# Patient Record
Sex: Female | Born: 1965 | Race: White | Hispanic: No | Marital: Married | Smoking: Never smoker
Health system: Southern US, Community
[De-identification: ages and names within clinical notes are randomized; demographics above are authoritative.]

## PROBLEM LIST (undated history)

## (undated) DIAGNOSIS — F909 Attention-deficit hyperactivity disorder, unspecified type: Secondary | ICD-10-CM

## (undated) DIAGNOSIS — G4730 Sleep apnea, unspecified: Secondary | ICD-10-CM

## (undated) DIAGNOSIS — K219 Gastro-esophageal reflux disease without esophagitis: Secondary | ICD-10-CM

## (undated) DIAGNOSIS — F419 Anxiety disorder, unspecified: Secondary | ICD-10-CM

## (undated) DIAGNOSIS — M069 Rheumatoid arthritis, unspecified: Secondary | ICD-10-CM

## (undated) DIAGNOSIS — K760 Fatty (change of) liver, not elsewhere classified: Secondary | ICD-10-CM

## (undated) DIAGNOSIS — K2270 Barrett's esophagus without dysplasia: Secondary | ICD-10-CM

## (undated) HISTORY — DX: Barrett's esophagus without dysplasia: K22.70

## (undated) HISTORY — PX: HERNIA REPAIR: SHX51

## (undated) HISTORY — PX: CHOLECYSTECTOMY: SHX55

## (undated) HISTORY — DX: Fatty (change of) liver, not elsewhere classified: K76.0

## (undated) HISTORY — PX: OOPHORECTOMY: SHX86

## (undated) HISTORY — DX: Attention-deficit hyperactivity disorder, unspecified type: F90.9

## (undated) HISTORY — PX: OTHER SURGICAL HISTORY: SHX169

## (undated) HISTORY — PX: TONSILLECTOMY: SUR1361

## (undated) HISTORY — DX: Anxiety disorder, unspecified: F41.9

## (undated) HISTORY — PX: APPENDECTOMY: SHX54

## (undated) HISTORY — DX: Gastro-esophageal reflux disease without esophagitis: K21.9

## (undated) HISTORY — DX: Rheumatoid arthritis, unspecified: M06.9

---

## 2007-07-26 ENCOUNTER — Encounter: Payer: Self-pay | Admitting: Cardiovascular Disease

## 2007-07-26 ENCOUNTER — Ambulatory Visit: Payer: Self-pay | Admitting: Cardiovascular Disease

## 2007-07-26 ENCOUNTER — Ambulatory Visit: Payer: Self-pay

## 2007-07-26 LAB — CONVERTED CEMR LAB
Amylase: 43 units/L (ref 27–131)
Basophils Absolute: 0.1 10*3/uL (ref 0.0–0.1)
Bilirubin, Direct: 0.1 mg/dL (ref 0.0–0.3)
CK-MB: 1.9 ng/mL (ref 0.3–4.0)
CO2: 24 meq/L (ref 19–32)
Calcium: 9.8 mg/dL (ref 8.4–10.5)
GFR calc Af Amer: 119 mL/min
Hemoglobin: 14.1 g/dL (ref 12.0–15.0)
Lipase: 28 units/L (ref 11.0–59.0)
Lymphocytes Relative: 32.3 % (ref 12.0–46.0)
MCHC: 34.5 g/dL (ref 30.0–36.0)
Monocytes Absolute: 0.5 10*3/uL (ref 0.1–1.0)
Neutro Abs: 5.7 10*3/uL (ref 1.4–7.7)
RDW: 12.6 % (ref 11.5–14.6)
Sodium: 138 meq/L (ref 135–145)
Total Bilirubin: 0.7 mg/dL (ref 0.3–1.2)

## 2007-07-28 ENCOUNTER — Ambulatory Visit: Payer: Self-pay | Admitting: Cardiology

## 2009-02-24 HISTORY — PX: ESOPHAGOGASTRODUODENOSCOPY: SHX1529

## 2010-07-09 NOTE — Assessment & Plan Note (Signed)
[facility]                            CARDIOLOGY OFFICE NOTE   Le, Jessica                         MRN:          [id number]  DATE:07/26/2007                            DOB:          1965-11-10    Jessica Le is a 45-year patient who works here in the [facility].  She was added onto my schedule today for dizziness, shortness of breath,  and chest pain.  The patient has no previous a history of coronary  artery disease.  She has felt ill for about a week and half or so.  Apparently, she went to Urgent Care for some loose stools and question  of a UTI recently and was placed on antibiotics.   She was a bit short of breath yesterday.  She was playing at her  daughter's birthday party, with limited ambulation.  She felt some  presyncope and palpitations.  She got a little diaphoretic.  There was  no pleuritic chest pain, but this was unusual shortness of breath for  her.  She is a nonsmoker.  Does not have chronic lung disease.  She  subsequently felt poorly at night and had some loose stool.  She woke up  about 3:00 in the morning with significant chest pain.  It seem to be  centered around her esophagus.  She does have reflux and takes Zegerid.  She took some, and this did not improve the pain or burning in her  throat.  She subsequently vomited x1.  This morning, she was able to eat  breakfast.  However, she continues to have pressure and burning in her  throat and chest.  She has not had any significant fevers.  There have  been loose stools.  There has been no melena or bleeding.   The pain does not sound cardiac in nature.  Her shortness of breath  persists.  She is not coughing.  There is no sputum production or fever.   REVIEW OF SYSTEMS:  Otherwise negative.   PAST MEDICAL HISTORY:  1. Previous appendectomy.  2. Tonsillectomy.  3. C-section.  4. Sinus surgery.  5. Right oophorectomy.  6. D&C.  7. She actually was quite sick in  2007.  She was carrying 20-month-old      twins and apparently had a hemangioma rupture and bled down to a      hemoglobin of 1 or 2.   FAMILY HISTORY:  Unremarkable.  Mother is alive.  Father died at age 10  of liver cancer.   The patient is happily married.  She has one 67-year-old daughter whose  birthday just happened this weekend.  She does not smoke or drink.  She  is fairly sedentary and has gained about 20 pounds over the last couple  of years.  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   CURRENT MEDICATIONS:  Include Zegerid, estrogen patch, and Prometrium.   ALLERGIES:  1. LATEX.  2. CODEINE.  3. SULFA   PHYSICAL EXAMINATION:  GENERAL:  Remarkable for a healthy-appearing,  middle-aged white female in no distress.  VITAL SIGNS:  Pulse 76 and regular.  She does not appear toxic.  Weight  is 155, blood pressure is 140/80.  HEENT:  Unremarkable.  NECK:  Carotids are normal, without bruit, no lymphadenopathy,  thyromegaly, or JVP elevation.  LUNGS:  Clear.  Good diaphragmatic motion.  No wheezing.  HEART:  S1, S2, normal heart sounds, PMI normal.  ABDOMEN:  Soft.  She has some left lower quadrant pain to palpation,  also some right upper quadrant pain.  Bowel sounds are positive.  No  bruit, no hepatosplenomegaly, no hepatojugular reflux.  EXTREMITIES:  Distal pulses are intact, no edema.  NEUROLOGIC:  Nonfocal.  SKIN:  Warm and dry.  MUSCULOSKELETAL:  No muscle weakness.   EKG:  Normal.   IMPRESSION:  1. Chest pain, atypical.  Check CPK.  Follow up echocardiogram.  So      long as there are no regional wall motion abnormalities, I do not      think this needs further workup.  2. Nausea, loose stools, recent antibiotic.  She needs a Clostridium      difficile titer.  I am also little bit concerned about the left      lower quadrant pain.  We will do a CT scan to rule out      diverticulitis or intraabdominal process, particularly since she      has had a ruptured  hemangioma and previous abdominal surgeries.  3. Shortness of breath, with presyncope.  We will check a CT scan to      rule out PE.  A 2D echocardiogram will also be helpful in regards      to assessing RV and LV function.  4. Question urinary tract symptoms.  As well, the patient was      initially put on antibiotics because of urinary tract symptoms and      loose stools.  We will check a UA to rule out any significant      microscopic hematuria or recurrent infection.   I explained to the patient I did not think her symptoms had anything to  do with her heart.  She will need primary care followup, as something  apparently is going on which may be more GU/GI related.  She does have a  history of reflux.  She will continue Zegerid.  At some point, she may  need an EGD to further assess for esophagitis or gastritis.   We will start her preliminary workup in motion and rule out the most  severe issues and have her follow up with Dr. Tiburcio Pea in [place].     Noralyn Pick. Eden Emms, MD, [facility]  Electronically Signed    PCN/MedQ  DD: 07/26/2007  DT: 07/26/2007  Job #: [id number]   cc:   Emeline Gins, M.D.

## 2010-08-13 ENCOUNTER — Ambulatory Visit (HOSPITAL_BASED_OUTPATIENT_CLINIC_OR_DEPARTMENT_OTHER)
Admission: RE | Admit: 2010-08-13 | Payer: PRIVATE HEALTH INSURANCE | Source: Ambulatory Visit | Admitting: Orthopedic Surgery

## 2012-02-25 HISTORY — PX: COLONOSCOPY: SHX174

## 2016-02-25 HISTORY — PX: OTHER SURGICAL HISTORY: SHX169

## 2016-10-07 ENCOUNTER — Encounter: Payer: Self-pay | Admitting: Gastroenterology

## 2016-11-28 ENCOUNTER — Other Ambulatory Visit: Payer: Self-pay | Admitting: Gastroenterology

## 2016-11-28 ENCOUNTER — Ambulatory Visit (INDEPENDENT_AMBULATORY_CARE_PROVIDER_SITE_OTHER): Payer: No Typology Code available for payment source | Admitting: Gastroenterology

## 2016-11-28 ENCOUNTER — Encounter: Payer: Self-pay | Admitting: Gastroenterology

## 2016-11-28 VITALS — BP 139/88 | HR 82 | Temp 97.9°F | Ht 62.0 in | Wt 183.0 lb

## 2016-11-28 DIAGNOSIS — R194 Change in bowel habit: Secondary | ICD-10-CM | POA: Diagnosis not present

## 2016-11-28 DIAGNOSIS — R1011 Right upper quadrant pain: Secondary | ICD-10-CM | POA: Insufficient documentation

## 2016-11-28 DIAGNOSIS — K769 Liver disease, unspecified: Secondary | ICD-10-CM

## 2016-11-28 DIAGNOSIS — K2270 Barrett's esophagus without dysplasia: Secondary | ICD-10-CM | POA: Diagnosis not present

## 2016-11-28 DIAGNOSIS — R197 Diarrhea, unspecified: Secondary | ICD-10-CM | POA: Insufficient documentation

## 2016-11-28 NOTE — Patient Instructions (Signed)
1. MRI liver as scheduled.  2. Labs and stool tests as ordered. 3. I will obtain copy of your last colonoscopy and upper endoscopy results for review.

## 2016-11-28 NOTE — Progress Notes (Signed)
Primary Care Physician:  Bronwen Betters, FNP  Primary Gastroenterologist:  Barney Drain, MD   Chief Complaint  Patient presents with  . liver lesions    started 07/2016, fatigue, tender ruq    HPI:  Jessica Le is a 51 y.o. female here at the request of Bronwen Betters, FNP for further evaluation of liver lesions and RUQ pain.   Patient has multiple GI issues. Her prior gastroenterologist retired (Dr. Algis Greenhouse) and needs to establish care here. She has h/o Barrett's esophagus and is overdue for EGD. H/o liver lesions and FH of primary liver cancer (father deceased in his 58s).   Patient has not felt well in several months. She has severe fatigue. Falls asleep standing up. Falls asleep at stop lights. Had sleep study but states she has to go for additional testing this month. Complains of change in bowel habits. He used to have a bowel movement once daily. Now has postprandial diarrhea every time she eats. Wakes up early a.m. and has diarrhea. Blood on the toilet tissue at times. No melena. Denies associated abdominal pain. Never has solid stools. Denies significant reflux. Watching her diet carefully. Takes Zegerid only as needed. Had taken it regularly up until about 6 months ago. In the past had severe reflux associated with pregnancy. Denies dysphagia. She's also had a lot of pain including back pain and hip pain. Planes of extreme tenderness in the right upper quadrant, can't touch the area. Used to lay on her stomach when she slept this now. Sometimes pain is worse with food but often present without meals. Pain in the back as well. She states it feels like her gallbladder symptoms when she had gallstones. She complains of lesions that on her legs, turn dark red, developed a scale and scab. This is been occurring for a few weeks.  She has worked for years as a Public relations account executive. Now working in the [employer] since March. 8 hour shifts as opposed to 12 hour shifts. She has to take a nap when she gets  home before she can cooked supper.  CT abdomen and pelvis with and without contrast dated 10/01/2016, compared to November 2014 study 2 low-density space-occupying lesions noted in the liver, unchanged from prior study, probably cyst which measured 2.1 cm and 1.5 cm in diameter. She had a CT abdomen back in 2009 at our facility, posterior right hepatic lobe 1.7 cm lesion with peripheral enhancement and possible nodular enhancement. Another right hepatic lobe lesion measuring 4 mm to smaller evaluate. MRI recommended at the time.  Patient's father deceased in his 45s with primary liver cancer. Never drank alcohol. Was not a cirrhotic, no history of viral hepatitis.  Patient has a history of pregnancy loss at age 9, conceived quads through IVF, with amniocentesis she lost one baby, at 57 weeks she lost another baby. At 20 weeks she developed hemorrhaging and lost the last 2 babies. She states her hemoglobin dropped to 1.8, went into cardiac arrest. She states that there was concerned about her liver and an hemangioma at that time.   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 Essentially asymptomatic at this time  Current Outpatient Prescriptions  Medication Sig Dispense Refill  . azelastine (ASTELIN) 0.1 % nasal spray Place 1 spray into both nostrils 2 (two) times daily. Use in each nostril as directed    . estradiol (VIVELLE-DOT) 0.1 MG/24HR patch Place 1 patch onto the skin 2 (two) times a week.    Marland Kitchen  Multiple Vitamin (MULTIVITAMIN) tablet Take 1 tablet by mouth daily.    Earney Navy Bicarbonate (ZEGERID) 20-1100 MG CAPS capsule Take 1 capsule by mouth 2 (two) times daily.    . progesterone (PROMETRIUM) 100 MG capsule Take 100 mg by mouth daily. Takes first 10 days of every month    . triamcinolone (NASACORT ALLERGY 24HR) 55 MCG/ACT AERO nasal inhaler Place 1 spray into the nose 2 (two) times daily.     No current facility-administered medications for this visit.      Allergies as of 11/28/2016 - Review Complete 11/28/2016  Allergen Reaction Noted  . Latex Anaphylaxis 11/28/2016  . Vancomycin Anaphylaxis 11/28/2016  . Sulfa antibiotics  11/28/2016    Past Medical History:  Diagnosis Date  . ADHD   . Anxiety   . Barrett's esophagus   . GERD (gastroesophageal reflux disease)   . RA (rheumatoid arthritis) ([place])     Past Surgical History:  Procedure Laterality Date  . APPENDECTOMY    . Bilateral myringotomy and tube placement  2018  . CESAREAN SECTION    . CHOLECYSTECTOMY    . COLONOSCOPY  2014  . ESOPHAGOGASTRODUODENOSCOPY  2011  . HERNIA REPAIR     incisional hernia  . Left ulnar surgery    . OOPHORECTOMY    . TONSILLECTOMY      Family History  Problem Relation Age of Onset  . Liver cancer Father   . Colon cancer Neg Hx     Social History   Social History  . Marital status: Married    Spouse name: N/A  . Number of children: N/A  . Years of education: N/A   Occupational History  . Not on file.   Social History Main Topics  . Smoking status: Never Smoker  . Smokeless tobacco: Never Used  . Alcohol use No  . Drug use: No  . Sexual activity: Not on file   Other Topics Concern  . Not on file   Social History Narrative  . No narrative on file      ROS:  General: Negative for anorexia, unintentional weight loss, fever, chills, weakness.  +fatigue Eyes: Negative for vision changes.  ENT: Negative for hoarseness, difficulty swallowing , nasal congestion. CV: Negative for chest pain, angina, palpitations, dyspnea on exertion, peripheral edema.  Respiratory: Negative for dyspnea at rest, dyspnea on exertion, cough, sputum, wheezing.  GI: See history of present illness. GU:  Negative for dysuria, hematuria, urinary incontinence, urinary frequency, nocturnal urination.  MS: chronic pain Derm: Negative for rash or itching.  Neuro: Negative for weakness, abnormal sensation, seizure, frequent headaches, memory loss,  confusion.  Psych: Negative for anxiety, depression, suicidal ideation, hallucinations.  Endo: Negative for unusual weight change.  Heme: Negative for bruising or bleeding. Allergy: Negative for rash or hives.    Physical Examination:  BP 139/88   Pulse 82   Temp 97.9 F (36.6 C) (Oral)   Ht 5\' 2"  (1.575 m)   Wt 183 lb (83 kg)   LMP 09/28/2016 Comment: very few periods  BMI 33.47 kg/m    General: Well-nourished, well-developed in no acute distress. Tearful. Head: Normocephalic, atraumatic.   Eyes: Conjunctiva pink, no icterus. Mouth: Oropharyngeal mucosa moist and pink , no lesions erythema or exudate. Neck: Supple without thyromegaly, masses, or lymphadenopathy.  Lungs: Clear to auscultation bilaterally.  Heart: Regular rate and rhythm, no murmurs rubs or gallops.  Abdomen: Bowel sounds are normal,  nondistended, no hepatosplenomegaly or masses, no abdominal bruits or  hernia , no rebound or guarding.  Moderate RUQ tenderness. She also had anterior right lower ribcage tenderness Rectal: not performed Extremities: No lower extremity edema. No clubbing or deformities.  Neuro: Alert and oriented x 4 , grossly normal neurologically.  Skin: Warm and dry, no rash or jaundice.  Erythematous type, circular, papular lesion with eschar on the right anterior shin, induration noted. Psych: Alert and cooperative, normal mood and affect.   Imaging Studies: No results found.  Impression/plan:  51 year old female with multiple GI issues which require further evaluation. She reports history of Barrett's esophagus, due for surveillance EGD. We have requested records for review. She also has 2 known liver lesions as outlined, previously recommended MRI for further evaluation. Father succumbed to primary liver cancer in his 67s. Patient obviously quite concerned about these liver lesions. Initial CT with possible hemangioma versus nodular lesion. Would recommend MRI with Eovist at this time.    Patient with right upper quadrant pain, unclear etiology, at least in part musculoskeletal component. Doubt biliary etiology but we'll obtain labs. Cannot exclude duodenitis/gastritis/peptic ulcer disease. Further evaluation planned.  Change in bowel habits with postprandial diarrhea. No solid stools. Cannot exclude IBS whenever given daily exposures in the hospital setting, we will exclude infectious etiology such as C. difficile. Screen for celiac. Will likely need colonoscopy in the near future. Awaiting records for review. Unclear at this time if skin lesions are related to any of her GI issues although inflammatory bowel disease needs to be entertained.

## 2016-11-30 NOTE — Progress Notes (Signed)
Labs including cbc, kidney/liver, lipase, thyroid normal.  Celiac pending. MRI pending.

## 2016-12-01 ENCOUNTER — Telehealth: Payer: Self-pay

## 2016-12-01 LAB — CBC WITH DIFFERENTIAL/PLATELET
BASOS ABS: 0 10*3/uL (ref 0.0–0.2)
BASOS: 0 %
EOS (ABSOLUTE): 0.2 10*3/uL (ref 0.0–0.4)
Eos: 2 %
HEMOGLOBIN: 13.5 g/dL (ref 11.1–15.9)
Hematocrit: 39.9 % (ref 34.0–46.6)
IMMATURE GRANS (ABS): 0 10*3/uL (ref 0.0–0.1)
IMMATURE GRANULOCYTES: 0 %
LYMPHS: 33 %
Lymphocytes Absolute: 3.1 10*3/uL (ref 0.7–3.1)
MCH: 31.6 pg (ref 26.6–33.0)
MCHC: 33.8 g/dL (ref 31.5–35.7)
MCV: 93 fL (ref 79–97)
MONOCYTES: 4 %
Monocytes Absolute: 0.4 10*3/uL (ref 0.1–0.9)
NEUTROS ABS: 5.6 10*3/uL (ref 1.4–7.0)
NEUTROS PCT: 61 %
Platelets: 290 10*3/uL (ref 150–379)
RBC: 4.27 x10E6/uL (ref 3.77–5.28)
RDW: 14.3 % (ref 12.3–15.4)
WBC: 9.4 10*3/uL (ref 3.4–10.8)

## 2016-12-01 LAB — COMPREHENSIVE METABOLIC PANEL
ALT: 27 IU/L (ref 0–32)
AST: 18 IU/L (ref 0–40)
Albumin/Globulin Ratio: 2.2 (ref 1.2–2.2)
Albumin: 4.4 g/dL (ref 3.5–5.5)
Alkaline Phosphatase: 94 IU/L (ref 39–117)
BUN/Creatinine Ratio: 17 (ref 9–23)
BUN: 11 mg/dL (ref 6–24)
Bilirubin Total: 0.3 mg/dL (ref 0.0–1.2)
CALCIUM: 9.3 mg/dL (ref 8.7–10.2)
CO2: 23 mmol/L (ref 20–29)
Chloride: 103 mmol/L (ref 96–106)
Creatinine, Ser: 0.63 mg/dL (ref 0.57–1.00)
GFR, EST AFRICAN AMERICAN: 120 mL/min/{1.73_m2} (ref >59)
GFR, EST NON AFRICAN AMERICAN: 104 mL/min/{1.73_m2} (ref >59)
GLUCOSE: 82 mg/dL (ref 65–99)
Globulin, Total: 2 g/dL (ref 1.5–4.5)
Potassium: 4.4 mmol/L (ref 3.5–5.2)
Sodium: 142 mmol/L (ref 134–144)
TOTAL PROTEIN: 6.4 g/dL (ref 6.0–8.5)

## 2016-12-01 LAB — GI PROFILE, STOOL, PCR
ASTROVIRUS: NOT DETECTED
Adenovirus F 40/41: NOT DETECTED
C difficile toxin A/B: NOT DETECTED
Campylobacter: NOT DETECTED
Cryptosporidium: NOT DETECTED
Cyclospora cayetanensis: NOT DETECTED
ENTEROPATHOGENIC E COLI: NOT DETECTED
ENTEROTOXIGENIC E COLI: NOT DETECTED
Entamoeba histolytica: NOT DETECTED
Enteroaggregative E coli: NOT DETECTED
Giardia lamblia: NOT DETECTED
Norovirus GI/GII: NOT DETECTED
PLESIOMONAS SHIGELLOIDES: NOT DETECTED
ROTAVIRUS A: NOT DETECTED
SAPOVIRUS: NOT DETECTED
SHIGA-TOXIN-PRODUCING E COLI: NOT DETECTED
Salmonella: NOT DETECTED
Shigella/Enteroinvasive E coli: NOT DETECTED
VIBRIO CHOLERAE: NOT DETECTED
VIBRIO: NOT DETECTED
Yersinia enterocolitica: NOT DETECTED

## 2016-12-01 LAB — TSH: TSH: 1.35 u[IU]/mL (ref 0.450–4.500)

## 2016-12-01 LAB — TISSUE TRANSGLUTAMINASE, IGA

## 2016-12-01 LAB — LIPASE: Lipase: 32 U/L (ref 14–72)

## 2016-12-01 LAB — IGA: IgA/Immunoglobulin A, Serum: 295 mg/dL (ref 87–352)

## 2016-12-01 NOTE — Progress Notes (Signed)
Celiac negative too.

## 2016-12-01 NOTE — Telephone Encounter (Signed)
[facility] and spoke to [place]. No PA needed for MRI. No reference numbers given.

## 2016-12-01 NOTE — Progress Notes (Signed)
LMOM to call.

## 2016-12-01 NOTE — Telephone Encounter (Signed)
MRI Liver scheduled for 12/08/16 at 9:00am, pt tor arrive at 8:45am. NPO for 4 hours before test. Tried to call pt to inform her of appt, no answer, LMOVM. Letter also mailed.

## 2016-12-01 NOTE — Progress Notes (Signed)
Stool negative. Await pending studies.

## 2016-12-02 NOTE — Telephone Encounter (Signed)
Pt called office. She rescheduled MRI time but for the same day.

## 2016-12-02 NOTE — Progress Notes (Signed)
CC'ED TO PCP 

## 2016-12-02 NOTE — Progress Notes (Signed)
Pt is aware.  

## 2016-12-08 ENCOUNTER — Ambulatory Visit (HOSPITAL_COMMUNITY): Payer: No Typology Code available for payment source

## 2016-12-12 ENCOUNTER — Ambulatory Visit (HOSPITAL_COMMUNITY)
Admission: RE | Admit: 2016-12-12 | Discharge: 2016-12-12 | Disposition: A | Discharge status: Home / Self Care | Payer: No Typology Code available for payment source | Source: Ambulatory Visit | Attending: Gastroenterology | Admitting: Gastroenterology

## 2016-12-12 DIAGNOSIS — D1809 Hemangioma of other sites: Secondary | ICD-10-CM | POA: Insufficient documentation

## 2016-12-12 DIAGNOSIS — K760 Fatty (change of) liver, not elsewhere classified: Secondary | ICD-10-CM | POA: Diagnosis not present

## 2016-12-12 DIAGNOSIS — K769 Liver disease, unspecified: Secondary | ICD-10-CM | POA: Diagnosis present

## 2016-12-12 DIAGNOSIS — K7689 Other specified diseases of liver: Secondary | ICD-10-CM | POA: Insufficient documentation

## 2016-12-12 MED ORDER — GADOXETATE DISODIUM 0.25 MMOL/ML IV SOLN
8.0000 mL | Freq: Once | INTRAVENOUS | Status: AC | PRN
  Administered 2016-12-12: 8 mL via INTRAVENOUS

## 2016-12-26 NOTE — Progress Notes (Signed)
Noele, your MRI showed moderate to severe fatty liver. 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   For fatty liver--> Instructions for fatty liver: Recommend 1-2# weight loss per week until ideal body weight through exercise & diet. Low fat/cholesterol diet.   Avoid sweets, sodas, fruit juices, sweetened beverages like tea, etc. Gradually increase exercise from 15 min daily up to 1 hr per day 5 days/week. Limit alcohol use.  Would advise to proceed with upper endoscopy and colonoscopy with Dr. Oneida Alar. Dx: Barrett's esophagus, Right upper abdominal pain, change in bowels (diarrhea), rectal bleeding.   You may require additional f/u liver imaging to ensure stability of liver lesions, will discuss with Dr. Oneida Alar.

## 2016-12-29 ENCOUNTER — Encounter: Payer: Self-pay | Admitting: *Deleted

## 2016-12-29 ENCOUNTER — Other Ambulatory Visit: Payer: Self-pay | Admitting: *Deleted

## 2016-12-29 DIAGNOSIS — K625 Hemorrhage of anus and rectum: Secondary | ICD-10-CM

## 2016-12-29 DIAGNOSIS — K2270 Barrett's esophagus without dysplasia: Secondary | ICD-10-CM

## 2016-12-29 DIAGNOSIS — R1011 Right upper quadrant pain: Secondary | ICD-10-CM

## 2016-12-29 DIAGNOSIS — R197 Diarrhea, unspecified: Secondary | ICD-10-CM

## 2016-12-29 MED ORDER — NA SULFATE-K SULFATE-MG SULF 17.5-3.13-1.6 GM/177ML PO SOLN: 1.0000 | ORAL | 0 refills | Status: DC

## 2016-12-29 NOTE — Progress Notes (Signed)
LMOM to call.

## 2016-12-30 NOTE — Progress Notes (Signed)
PT is aware.

## 2016-12-30 NOTE — Progress Notes (Signed)
LMOM to call.

## 2017-01-26 ENCOUNTER — Other Ambulatory Visit: Payer: Self-pay

## 2017-01-26 ENCOUNTER — Ambulatory Visit (HOSPITAL_COMMUNITY)
Admission: RE | Admit: 2017-01-26 | Discharge: 2017-01-26 | Disposition: A | Discharge status: Home / Self Care | Payer: No Typology Code available for payment source | Source: Ambulatory Visit | Attending: Gastroenterology | Admitting: Gastroenterology

## 2017-01-26 ENCOUNTER — Encounter (HOSPITAL_COMMUNITY)
Admission: RE | Disposition: A | Discharge status: Home / Self Care | Payer: Self-pay | Source: Ambulatory Visit | Attending: Gastroenterology

## 2017-01-26 ENCOUNTER — Encounter (HOSPITAL_COMMUNITY): Payer: Self-pay | Admitting: *Deleted

## 2017-01-26 DIAGNOSIS — K2270 Barrett's esophagus without dysplasia: Secondary | ICD-10-CM | POA: Diagnosis not present

## 2017-01-26 DIAGNOSIS — G4730 Sleep apnea, unspecified: Secondary | ICD-10-CM | POA: Insufficient documentation

## 2017-01-26 DIAGNOSIS — K210 Gastro-esophageal reflux disease with esophagitis: Secondary | ICD-10-CM | POA: Diagnosis not present

## 2017-01-26 DIAGNOSIS — K625 Hemorrhage of anus and rectum: Secondary | ICD-10-CM

## 2017-01-26 DIAGNOSIS — K2980 Duodenitis without bleeding: Secondary | ICD-10-CM | POA: Diagnosis not present

## 2017-01-26 DIAGNOSIS — K228 Other specified diseases of esophagus: Secondary | ICD-10-CM | POA: Diagnosis not present

## 2017-01-26 DIAGNOSIS — K5730 Diverticulosis of large intestine without perforation or abscess without bleeding: Secondary | ICD-10-CM | POA: Insufficient documentation

## 2017-01-26 DIAGNOSIS — K2950 Unspecified chronic gastritis without bleeding: Secondary | ICD-10-CM | POA: Diagnosis not present

## 2017-01-26 DIAGNOSIS — Q438 Other specified congenital malformations of intestine: Secondary | ICD-10-CM | POA: Insufficient documentation

## 2017-01-26 DIAGNOSIS — F419 Anxiety disorder, unspecified: Secondary | ICD-10-CM | POA: Insufficient documentation

## 2017-01-26 DIAGNOSIS — R197 Diarrhea, unspecified: Secondary | ICD-10-CM | POA: Diagnosis not present

## 2017-01-26 DIAGNOSIS — Z79899 Other long term (current) drug therapy: Secondary | ICD-10-CM | POA: Insufficient documentation

## 2017-01-26 DIAGNOSIS — R1011 Right upper quadrant pain: Secondary | ICD-10-CM | POA: Insufficient documentation

## 2017-01-26 DIAGNOSIS — M069 Rheumatoid arthritis, unspecified: Secondary | ICD-10-CM | POA: Diagnosis not present

## 2017-01-26 DIAGNOSIS — F909 Attention-deficit hyperactivity disorder, unspecified type: Secondary | ICD-10-CM | POA: Insufficient documentation

## 2017-01-26 HISTORY — PX: COLONOSCOPY: SHX5424

## 2017-01-26 HISTORY — DX: Sleep apnea, unspecified: G47.30

## 2017-01-26 HISTORY — PX: ESOPHAGOGASTRODUODENOSCOPY: SHX5428

## 2017-01-26 SURGERY — COLONOSCOPY
Anesthesia: Moderate Sedation

## 2017-01-26 MED ORDER — MIDAZOLAM HCL 5 MG/5ML IJ SOLN
INTRAMUSCULAR | Status: DC | PRN
  Administered 2017-01-26: 1 mg via INTRAVENOUS
  Administered 2017-01-26: 2 mg via INTRAVENOUS
  Administered 2017-01-26: 1 mg via INTRAVENOUS
  Administered 2017-01-26: 2 mg via INTRAVENOUS
  Administered 2017-01-26: 1 mg via INTRAVENOUS

## 2017-01-26 MED ORDER — MEPERIDINE HCL 100 MG/ML IJ SOLN
INTRAMUSCULAR | Status: DC | PRN
  Administered 2017-01-26 (×2): 50 mg via INTRAVENOUS
  Administered 2017-01-26: 25 mg via INTRAVENOUS

## 2017-01-26 MED ORDER — ONDANSETRON HCL 4 MG/2ML IJ SOLN
INTRAMUSCULAR | Status: AC
  Filled 2017-01-26: qty 2

## 2017-01-26 MED ORDER — MEPERIDINE HCL 100 MG/ML IJ SOLN
INTRAMUSCULAR | Status: AC
  Filled 2017-01-26: qty 2

## 2017-01-26 MED ORDER — STERILE WATER FOR IRRIGATION IR SOLN
Status: DC | PRN
  Administered 2017-01-26: 100 mL

## 2017-01-26 MED ORDER — OMEPRAZOLE-SODIUM BICARBONATE 40-1100 MG PO CAPS
1.0000 | ORAL_CAPSULE | Freq: Every day | ORAL | 11 refills | Status: DC
Start: 2017-01-26 — End: 2022-06-20

## 2017-01-26 MED ORDER — MIDAZOLAM HCL 5 MG/5ML IJ SOLN
INTRAMUSCULAR | Status: AC
  Filled 2017-01-26: qty 10

## 2017-01-26 MED ORDER — LIDOCAINE VISCOUS 2 % MT SOLN
OROMUCOSAL | Status: AC
  Filled 2017-01-26: qty 15

## 2017-01-26 MED ORDER — SODIUM CHLORIDE 0.9 % IV SOLN
INTRAVENOUS | Status: DC
  Administered 2017-01-26: 11:00:00 via INTRAVENOUS

## 2017-01-26 MED ORDER — ONDANSETRON HCL 4 MG/2ML IJ SOLN
4.0000 mg | Freq: Once | INTRAMUSCULAR | Status: AC
  Administered 2017-01-26: 4 mg via INTRAVENOUS

## 2017-01-26 NOTE — Op Note (Signed)
[facility] Patient Name: Jessica Le Procedure Date: 01/26/2017 12:26 PM MRN: [id number] Date of Birth: 02-07-1966 Attending MD: Barney Drain MD, MD CSN: [id number] Age: 51 Admit Type: Outpatient Procedure:                Upper GI endoscopy WITH COLD FORCEPS BIOPSY Indications:              Abdominal pain in the right upper quadrant,                            Dyspepsia, Diarrhea Providers:                Barney Drain MD, MD, Janeece Riggers, RN, Tammy Vaught,                            RN Referring MD:              Medicines:                Midazolam 2 mg IV Complications:            No immediate complications. Estimated Blood Loss:     Estimated blood loss was minimal. Procedure:                Pre-Anesthesia Assessment:                           - Prior to the procedure, a History and Physical                            was performed, and patient medications and                            allergies were reviewed. The patient's tolerance of                            previous anesthesia was also reviewed. The risks                            and benefits of the procedure and the sedation                            options and risks were discussed with the patient.                            All questions were answered, and informed consent                            was obtained. Prior Anticoagulants: The patient has                            taken no previous anticoagulant or antiplatelet                            agents. ASA Grade Assessment: II - A patient with  mild systemic disease. After reviewing the risks                            and benefits, the patient was deemed in                            satisfactory condition to undergo the procedure.                            After obtaining informed consent, the endoscope was                            passed under direct vision. Throughout the                            procedure, the patient's  blood pressure, pulse, and                            oxygen saturations were monitored continuously. The                            [id number]) was introduced through the mouth,                            and advanced to the second part of duodenum. The                            upper GI endoscopy was accomplished without                            difficulty. The patient tolerated the procedure                            fairly well. Scope In: 12:29:39 PM Scope Out: 12:39:08 PM Total Procedure Duration: 0 hours 9 minutes 29 seconds  Findings:      The esophagus and gastroesophageal junction were examined with white       light from a forward view and retroflexed position. There were       esophageal mucosal changes consistent with short-segment Barrett's       esophagus. These changes involved the mucosa extending to the Z-line.       The maximum longitudinal extent of these esophageal mucosal changes was       1 cm in length. This was biopsied with a cold forceps for histology.      A few small sessile polyps were found in the gastric fundus and in the       gastric body. The polyp was removed with a cold biopsy forceps.       Resection and retrieval were complete.      A medium-sized hiatal hernia was present.      Patchy mild inflammation characterized by congestion (edema) and       erythema was found in the gastric antrum. Biopsies were taken with a       cold forceps for Helicobacter pylori testing(2: BODY, 3:       ANTRUM/ANGULARIS).  The examined duodenum was normal. Biopsies for histology were taken with       a cold forceps for evaluation of celiac disease(2: BULB, 4: 2ND PORTION). Impression:               - Esophageal mucosal changes consistent with                            short-segment Barrett's esophagus. Biopsied.                           - A few gastric polyps. Resected and retrieved.                           - Medium-sized hiatal hernia.                            - Gastritis. Biopsied.                           - Normal examined duodenum. Biopsied. Moderate Sedation:      Moderate (conscious) sedation was administered by the endoscopy nurse       and supervised by the endoscopist. The following parameters were       monitored: oxygen saturation, heart rate, blood pressure, and response       to care. Total physician intraservice time was 48 minutes. Recommendation:           - Await pathology results.                           - Follow an antireflux regimen indefinitely.                           - Continue present medications.                           - Use Zegerid (omeprazole+bicarb) 40 mg tab by                            mouth daily.                           - Return to my office in 3 months.                           - Patient has a contact number available for                            emergencies. The signs and symptoms of potential                            delayed complications were discussed with the                            patient. Return to normal activities tomorrow.  Written discharge instructions were provided to the                            patient. Procedure Code(s):        --- Professional ---                           [phone], Esophagogastroduodenoscopy, flexible,                            transoral; with biopsy, single or multiple                           99152, Moderate sedation services provided by the                            same physician or other qualified health care                            professional performing the diagnostic or                            therapeutic service that the sedation supports,                            requiring the presence of an independent trained                            observer to assist in the monitoring of the                            patient's level of consciousness and physiological                            status; initial  15 minutes of intraservice time,                            patient age 20 years or older                           [phone], Moderate sedation services; each additional                            15 minutes intraservice time                           99153, Moderate sedation services; each additional                            15 minutes intraservice time Diagnosis Code(s):        --- Professional ---                           K22.8, Other specified diseases of esophagus  K31.7, Polyp of stomach and duodenum                           K44.9, Diaphragmatic hernia without obstruction or                            gangrene                           K29.70, Gastritis, unspecified, without bleeding                           R10.11, Right upper quadrant pain                           R10.13, Epigastric pain                           R19.7, Diarrhea, unspecified CPT copyright 2016 American Medical Association. All rights reserved. The codes documented in this report are preliminary and upon coder review may  be revised to meet current compliance requirements. Barney Drain, MD Barney Drain MD, MD 01/26/2017 12:58:51 PM This report has been signed electronically. Number of Addenda: 0

## 2017-01-26 NOTE — Progress Notes (Signed)
Jessica Le may NOT return to work until AFTER 1:30 PM on Tuesday, January 27, 2017. Questions, call [phone]. Thanks!

## 2017-01-26 NOTE — Progress Notes (Signed)
Pt placed on nasal CPAP and tolerating well.

## 2017-01-26 NOTE — H&P (Signed)
Primary Care Physician:  Bronwen Betters, FNP Primary Gastroenterologist:  Dr. Oneida Alar  Pre-Procedure History & Physical: HPI:  Jessica Le is a 51 y.o. female here for Diarrhea/DYSPEPSIA.  Past Medical History:  Diagnosis Date  . ADHD   . Anxiety   . Barrett's esophagus   . GERD (gastroesophageal reflux disease)   . RA (rheumatoid arthritis) ([place])   . Sleep apnea     Past Surgical History:  Procedure Laterality Date  . APPENDECTOMY    . Bilateral myringotomy and tube placement  2018  . CESAREAN SECTION    . CHOLECYSTECTOMY    . COLONOSCOPY  2014  . ESOPHAGOGASTRODUODENOSCOPY  2011  . HERNIA REPAIR     incisional hernia  . Left ulnar surgery    . OOPHORECTOMY    . TONSILLECTOMY      Prior to Admission medications   Medication Sig Start Date End Date Taking? Authorizing Provider  Ascorbic Acid (VITAMIN C) 1000 MG tablet Take 1,000 mg by mouth daily.   Yes [provider]  azelastine (ASTELIN) 0.1 % nasal spray Place 1 spray into both nostrils 2 (two) times daily. Use in each nostril as directed   Yes [provider]  ciprofloxacin-dexamethasone (CIPRODEX) OTIC suspension Place 4 drops into both ears 2 (two) times daily as needed (for difficulty with ears draining.).   Yes [provider]  estradiol (VIVELLE-DOT) 0.1 MG/24HR patch Place 1 patch onto the skin 2 (two) times a week. TYPICALLY (Sunday & Wednesday)   Yes [provider]  Multiple Vitamin (MULTIVITAMIN WITH MINERALS) TABS tablet Take 1 tablet by mouth daily. WOMEN'S ONE A DAY   Yes [provider]  Na Sulfate-K Sulfate-Mg Sulf 17.5-3.13-1.6 GM/177ML SOLN Take 1 kit as directed by mouth. 12/29/16  Yes Janeann Paisley, Marga Melnick, MD  Omeprazole-Sodium Bicarbonate (ZEGERID) 20-1100 MG CAPS capsule Take 1 capsule by mouth 2 (two) times daily as needed (FOR ACID REFLUX.).    Yes [provider]  triamcinolone (NASACORT ALLERGY 24HR) 55 MCG/ACT AERO nasal inhaler Place 1 spray  into the nose 2 (two) times daily.   Yes [provider]  ibuprofen (ADVIL,MOTRIN) 200 MG tablet Take 600-800 mg by mouth every 6 (six) hours as needed (for pain.).    [provider]  progesterone (PROMETRIUM) 100 MG capsule Take 100 mg by mouth See admin instructions. TAKE 1 CAPSULE (100 MG) BY MOUTH AT BEDTIME FOR DAYS 1-10 OF THE MONTH.    [provider]    Allergies as of 12/29/2016 - Review Complete 11/28/2016  Allergen Reaction Noted  . Latex Anaphylaxis 11/28/2016  . Vancomycin Anaphylaxis 11/28/2016  . Sulfa antibiotics  11/28/2016    Family History  Problem Relation Age of Onset  . Liver cancer Father   . Colon cancer Neg Hx     Social History   Socioeconomic History  . Marital status: Married    Spouse name: Not on file  . Number of children: Not on file  . Years of education: Not on file  . Highest education level: Not on file  Social Needs  . Financial resource strain: Not on file  . Food insecurity - worry: Not on file  . Food insecurity - inability: Not on file  . Transportation needs - medical: Not on file  . Transportation needs - non-medical: Not on file  Occupational History  . Not on file  Tobacco Use  . Smoking status: Never Smoker  . Smokeless tobacco: Never Used  Substance and Sexual  Activity  . Alcohol use: No  . Drug use: No  . Sexual activity: Not on file  Other Topics Concern  . Not on file  Social History Narrative  . Not on file    Review of Systems: See HPI, otherwise negative ROS   Physical Exam: BP (!) 145/72   Pulse 76   Temp 97.9 F (36.6 C) (Oral)   Resp 15   Ht 5' 2"  (1.575 m)   Wt 180 lb (81.6 kg)   LMP 09/28/2016 Comment: very few periods  SpO2 100%   BMI 32.92 kg/m  General:   Alert,  pleasant and cooperative in NAD Head:  Normocephalic and atraumatic. Neck:  Supple; Lungs:  Clear throughout to auscultation.    Heart:  Regular rate and rhythm. Abdomen:  Soft, nontender and  nondistended. Normal bowel sounds, without guarding, and without rebound.   Neurologic:  Alert and  oriented x4;  grossly normal neurologically.  Impression/Plan:     Diarrhea/DYSPEPSIA  PLAN: TCS/EGD TODAY WITH BIOPSY DISCUSSED PROCEDURE, BENEFITS, & RISKS: < 1% chance of medication reaction, bleeding, perforation, or rupture of spleen/liver.

## 2017-01-26 NOTE — Discharge Instructions (Addendum)
YOU DID NOT HAVE ANY POLYPS.  You have HAVE CHANGES IN YOUR ESOPHAGUS DUE TO REFLUX. YOU HAVE gastritis/DUODENITIS, & benign stomach polyps. I biopsied your ESOPHAGUS, stomach, SMALL BOWEL, & colon. YOUR ABDOMINAL PAIN IS MOST LIKELY DUE TO GASTRITIS/DUODENITIS.     STRICTLY AVOID ASPIRIN, BC/GOODY POWDERS, IBUPROFEN/MOTRIN, OR NAPROXEN/ALEVE. USE TYLENOL IF NEEDED FOR PAIN.  DRINK WATER TO KEEP YOUR URINE LIGHT YELLOW.  CONTINUE YOUR WEIGHT LOSS EFFORTS. A WEIGHT OF 160 LBS  WILL GET YOUR BODY MASS INDEX(BMI) UNDER 30.  WHILE I DO NOT WANT TO ALARM YOU, YOUR BODY MASS INDEX IS CURRENTLY OVER 30 WHICH MEANS YOU ARE OBESE. OBESITY IS ASSOCIATED WITH AN INCREASED RISK FOR CIRRHOSIS AND ALL CANCERS, INCLUDING ESOPHAGEAL AND COLON CANCER.  FOLLOW A HIGH FIBER/LOW FAT DIET. AVOID ITEMS THAT CAUSE BLOATING. SEE INFO BELOW.  AVOID REFLUX TRIGGERS. SEE INFO BELOW.  CONTINUE ZEGERID.  TAKE 30 MINUTES PRIOR TO YOUR FIRST MEAL DAILY.  YOUR BIOPSY RESULTS WILL BE AVAILABLE IN MY CHART AFTER DEC 7 AND MY OFFICE WILL CONTACT YOU IN 10-14 DAYS WITH YOUR RESULTS.    FOLLOW UP IN 3 MOS.  Wed.,April 29, 2017 at 3:00PM with Neil Crouch, PA  I DO NOT SEE A REASON FOR ROUTINE UPPER ENDOSCOPY FOR BARRETT'S SCREENING. HOWEVER IF YOU WOULD LIKE ONE IT COULD BE PERFORMED IN 5 YEARS.  Next colonoscopy IN 10 YEARS.   ENDOSCOPY Care After Read the instructions outlined below and refer to this sheet in the next week. These discharge instructions provide you with general information on caring for yourself after you leave the hospital. While your treatment has been planned according to the most current medical practices available, unavoidable complications occasionally occur. If you have any problems or questions after discharge, call DR. FIELDS, [phone].  ACTIVITY  You may resume your regular activity, but move at a slower pace for the next 24 hours.   Take frequent rest periods for the next 24 hours.    Walking will help get rid of the air and reduce the bloated feeling in your belly (abdomen).   No driving for 24 hours (because of the medicine (anesthesia) used during the test).   You may shower.   Do not sign any important legal documents or operate any machinery for 24 hours (because of the anesthesia used during the test).    NUTRITION  Drink plenty of fluids.   You may resume your normal diet as instructed by your doctor.   Begin with a light meal and progress to your normal diet. Heavy or fried foods are harder to digest and may make you feel sick to your stomach (nauseated).   Avoid alcoholic beverages for 24 hours or as instructed.    MEDICATIONS  You may resume your normal medications.   WHAT YOU CAN EXPECT TODAY  Some feelings of bloating in the abdomen.   Passage of more gas than usual.   Spotting of blood in your stool or on the toilet paper  .  IF YOU HAD POLYPS REMOVED DURING THE ENDOSCOPY:  Eat a soft diet IF YOU HAVE NAUSEA, BLOATING, ABDOMINAL PAIN, OR VOMITING.    FINDING OUT THE RESULTS OF YOUR TEST Not all test results are available during your visit. DR. Oneida Alar WILL CALL YOU WITHIN 14 DAYS OF YOUR PROCEDUE WITH YOUR RESULTS. Do not assume everything is normal if you have not heard from DR. FIELDS, CALL HER OFFICE AT [phone].  SEEK IMMEDIATE MEDICAL ATTENTION AND CALL THE OFFICE: [phone]  IF:  You have more than a spotting of blood in your stool.   Your belly is swollen (abdominal distention).   You are nauseated or vomiting.   You have a temperature over 101F.   You have abdominal pain or discomfort that is severe or gets worse throughout the day.    Gastritis/DUODENITIS  Gastritis is an inflammation (the body's way of reacting to injury and/or infection) of the stomach. DUODENITIS is an inflammation (the body's way of reacting to injury and/or infection) of the FIRST PART OF THE SMALL INTESTINES. It is often caused by  bacterial (germ) infections. It can also be caused BY ASPIRIN, BC/GOODY POWDER'S, (IBUPROFEN) MOTRIN, OR ALEVE (NAPROXEN), chemicals (including alcohol), SPICY FOODS, and medications. This illness may be associated with generalized malaise (feeling tired, not well), UPPER ABDOMINAL STOMACH cramps, and fever. One common bacterial cause of gastritis is an organism known as H. Pylori. This can be treated with antibiotics.    High-Fiber Diet A high-fiber diet changes your normal diet to include more whole grains, legumes, fruits, and vegetables. Changes in the diet involve replacing refined carbohydrates with unrefined foods. The calorie level of the diet is essentially unchanged. The Dietary Reference Intake (recommended amount) for adult males is 38 grams per day. For adult females, it is 25 grams per day. Pregnant and lactating women should consume 28 grams of fiber per day. Fiber is the intact part of a plant that is not broken down during digestion. Functional fiber is fiber that has been isolated from the plant to provide a beneficial effect in the body. PURPOSE  Increase stool bulk.   Ease and regulate bowel movements.   Lower cholesterol.   REDUCE RISK OF COLON CANCER  INDICATIONS THAT YOU NEED MORE FIBER  Constipation and hemorrhoids.   Uncomplicated diverticulosis (intestine condition) and irritable bowel syndrome.   Weight management.   As a protective measure against hardening of the arteries (atherosclerosis), diabetes, and cancer.   GUIDELINES FOR INCREASING FIBER IN THE DIET  Start adding fiber to the diet slowly. A gradual increase of about 5 more grams (2 slices of whole-wheat bread, 2 servings of most fruits or vegetables, or 1 bowl of high-fiber cereal) per day is best. Too rapid an increase in fiber may result in constipation, flatulence, and bloating.   Drink enough water and fluids to keep your urine clear or pale yellow. Water, juice, or caffeine-free drinks are  recommended. Not drinking enough fluid may cause constipation.   Eat a variety of high-fiber foods rather than one type of fiber.   Try to increase your intake of fiber through using high-fiber foods rather than fiber pills or supplements that contain small amounts of fiber.   The goal is to change the types of food eaten. Do not supplement your present diet with high-fiber foods, but replace foods in your present diet.   INCLUDE A VARIETY OF FIBER SOURCES  Replace refined and processed grains with whole grains, canned fruits with fresh fruits, and incorporate other fiber sources. White rice, white breads, and most bakery goods contain little or no fiber.   Brown whole-grain rice, buckwheat oats, and many fruits and vegetables are all good sources of fiber. These include: broccoli, Brussels sprouts, cabbage, cauliflower, beets, sweet potatoes, white potatoes (skin on), carrots, tomatoes, eggplant, squash, berries, fresh fruits, and dried fruits.   Cereals appear to be the richest source of fiber. Cereal fiber is found in whole grains and bran. Bran is the fiber-rich outer coat  of cereal grain, which is largely removed in refining. In whole-grain cereals, the bran remains. In breakfast cereals, the largest amount of fiber is found in those with "bran" in their names. The fiber content is sometimes indicated on the label.   You may need to include additional fruits and vegetables each day.   In baking, for 1 cup white flour, you may use the following substitutions:   1 cup whole-wheat flour minus 2 tablespoons.   1/2 cup white flour plus 1/2 cup whole-wheat flour.    Low-Fat Diet BREADS, CEREALS, PASTA, RICE, DRIED PEAS, AND BEANS These products are high in carbohydrates and most are low in fat. Therefore, they can be increased in the diet as substitutes for fatty foods. They too, however, contain calories and should not be eaten in excess. Cereals can be eaten for snacks as well as for  breakfast.  Include foods that contain fiber (fruits, vegetables, whole grains, and legumes). Research shows that fiber may lower blood cholesterol levels, especially the water-soluble fiber found in fruits, vegetables, oat products, and legumes. FRUITS AND VEGETABLES It is good to eat fruits and vegetables. Besides being sources of fiber, both are rich in vitamins and some minerals. They help you get the daily allowances of these nutrients. Fruits and vegetables can be used for snacks and desserts. MEATS Limit lean meat, chicken, [place], and fish to no more than 6 ounces per day. Beef, Pork, and Lamb Use lean cuts of beef, pork, and lamb. Lean cuts include:  Extra-lean ground beef.  Arm roast.  Sirloin tip.  Center-cut ham.  Round steak.  Loin chops.  Rump roast.  Tenderloin.  Trim all fat off the outside of meats before cooking. It is not necessary to severely decrease the intake of red meat, but lean choices should be made. Lean meat is rich in protein and contains a highly absorbable form of iron. Premenopausal women, in particular, should avoid reducing lean red meat because this could increase the risk for low red blood cells (iron-deficiency anemia). The organ meats, such as liver, sweetbreads, kidneys, and brain are very rich in cholesterol. They should be limited. Chicken and [place] These are good sources of protein. The fat of poultry can be reduced by removing the skin and underlying fat layers before cooking. Chicken and [place] can be substituted for lean red meat in the diet. Poultry should not be fried or covered with high-fat sauces. Fish and Shellfish Fish is a good source of protein. Shellfish contain cholesterol, but they usually are low in saturated fatty acids. The preparation of fish is important. Like chicken and [place], they should not be fried or covered with high-fat sauces. EGGS Egg whites contain no fat or cholesterol. They can be eaten often. Try 1 to 2 egg whites  instead of whole eggs in recipes or use egg substitutes that do not contain yolk. MILK AND DAIRY PRODUCTS Use skim or 1% milk instead of 2% or whole milk. Decrease whole milk, natural, and processed cheeses. Use nonfat or low-fat (2%) cottage cheese or low-fat cheeses made from vegetable oils. Choose nonfat or low-fat (1 to 2%) yogurt. Experiment with evaporated skim milk in recipes that call for heavy cream. Substitute low-fat yogurt or low-fat cottage cheese for sour cream in dips and salad dressings. Have at least 2 servings of low-fat dairy products, such as 2 glasses of skim (or 1%) milk each day to help get your daily calcium intake.  FATS AND OILS Reduce the total intake  of fats, especially saturated fat. Butterfat, lard, and beef fats are high in saturated fat and cholesterol. These should be avoided as much as possible. Vegetable fats do not contain cholesterol, but certain vegetable fats, such as coconut oil, palm oil, and palm kernel oil are very high in saturated fats. These should be limited. These fats are often used in bakery goods, processed foods, popcorn, oils, and nondairy creamers. Vegetable shortenings and some peanut butters contain hydrogenated oils, which are also saturated fats. Read the labels on these foods and check for saturated vegetable oils. Unsaturated vegetable oils and fats do not raise blood cholesterol. However, they should be limited because they are fats and are high in calories. Total fat should still be limited to 30% of your daily caloric intake. Desirable liquid vegetable oils are corn oil, cottonseed oil, olive oil, canola oil, safflower oil, soybean oil, and sunflower oil. Peanut oil is not as good, but small amounts are acceptable. Buy a heart-healthy tub margarine that has no partially hydrogenated oils in the ingredients. Mayonnaise and salad dressings often are made from unsaturated fats, but they should also be limited because of their high calorie and fat  content. Seeds, nuts, peanut butter, olives, and avocados are high in fat, but the fat is mainly the unsaturated type. These foods should be limited mainly to avoid excess calories and fat. OTHER EATING TIPS Snacks  Most sweets should be limited as snacks. They tend to be rich in calories and fats, and their caloric content outweighs their nutritional value. Some good choices in snacks are graham crackers, melba toast, soda crackers, bagels (no egg), English muffins, fruits, and vegetables. These snacks are preferable to snack crackers, [place] fries, and chips. Popcorn should be air-popped or cooked in small amounts of liquid vegetable oil. Desserts Eat fruit, low-fat yogurt, and fruit ices. AVOID pastries, cake, and cookies. Sherbet, angel food cake, gelatin dessert, frozen low-fat yogurt, or other frozen products that do not contain saturated fat (pure fruit juice bars, frozen ice pops) are also acceptable.  COOKING METHODS Choose those methods that use little or no fat. They include: Poaching.  Braising.  Steaming.  Grilling.  Baking.  Stir-frying.  Broiling.  Microwaving.  Foods can be cooked in a nonstick pan without added fat, or use a nonfat cooking spray in regular cookware. Limit fried foods and avoid frying in saturated fat. Add moisture to lean meats by using water, broth, cooking wines, and other nonfat or low-fat sauces along with the cooking methods mentioned above. Soups and stews should be chilled after cooking. The fat that forms on top after a few hours in the refrigerator should be skimmed off. When preparing meals, avoid using excess salt. Salt can contribute to raising blood pressure in some people. EATING AWAY FROM HOME Order entres, potatoes, and vegetables without sauces or butter. When meat exceeds the size of a deck of cards (3 to 4 ounces), the rest can be taken home for another meal. Choose vegetable or fruit salads and ask for low-calorie salad dressings to be  served on the side. Use dressings sparingly. Limit high-fat toppings, such as bacon, crumbled eggs, cheese, sunflower seeds, and olives. Ask for heart-healthy tub margarine instead of butter.  Hemorrhoids Hemorrhoids are dilated (enlarged) veins around the rectum. Sometimes clots will form in the veins. This makes them swollen and painful. These are called thrombosed hemorrhoids. Causes of hemorrhoids include:  Constipation.   Straining to have a bowel movement.   [place]  CARE INSTRUCTIONS  Eat a well balanced diet and drink 6 to 8 glasses of water every day to avoid constipation. You may also use a bulk laxative.   Avoid straining to have bowel movements.   Keep anal area dry and clean.   Do not use a donut shaped pillow or sit on the toilet for long periods. This increases blood pooling and pain.   Move your bowels when your body has the urge; this will require less straining and will decrease pain and pressure.

## 2017-01-26 NOTE — Op Note (Signed)
[facility] Patient Name: Jessica Le Procedure Date: 01/26/2017 11:23 AM MRN: [id number] Date of Birth: 1965/06/22 Attending MD: Barney Drain MD, MD CSN: [id number] Age: 51 Admit Type: Outpatient Procedure:                Colonoscopy WITH COLD FORCEPS BIOPSY Indications:              Abdominal pain in the right upper quadrant,                            Clinically significant diarrhea of unexplained                            origin Providers:                Barney Drain MD, MD, Janeece Riggers, RN, Tammy Vaught,                            RN Referring MD:              Medicines:                Meperidine 100 mg IV, Midazolam 5 mg IV,                            Ondansetron 4 mg IV Complications:            No immediate complications. Estimated Blood Loss:     Estimated blood loss was minimal. Procedure:                Pre-Anesthesia Assessment:                           - Prior to the procedure, a History and Physical                            was performed, and patient medications and                            allergies were reviewed. The patient's tolerance of                            previous anesthesia was also reviewed. The risks                            and benefits of the procedure and the sedation                            options and risks were discussed with the patient.                            All questions were answered, and informed consent                            was obtained. Prior Anticoagulants: The patient has  taken no previous anticoagulant or antiplatelet                            agents. ASA Grade Assessment: II - A patient with                            mild systemic disease. After reviewing the risks                            and benefits, the patient was deemed in                            satisfactory condition to undergo the procedure.                            After obtaining informed consent, the colonoscope                            was passed under direct vision. Throughout the                            procedure, the patient's blood pressure, pulse, and                            oxygen saturations were monitored continuously. The                            EC-[id number] ([id number]) scope was introduced through                            the anus and advanced to the 3 cm into the ileum.                            The colonoscopy was technically difficult and                            complex due to restricted mobility of the colon and                            significant looping. Successful completion of the                            procedure was aided by increasing the dose of                            sedation medication and COLOWRAP. The patient                            tolerated the procedure fairly well. The quality of                            the bowel preparation was excellent. The terminal  ileum, ileocecal valve, appendiceal orifice, and                            rectum were photographed. Scope In: 12:00:02 PM Scope Out: 12:23:57 PM Scope Withdrawal Time: 0 hours 11 minutes 26 seconds  Total Procedure Duration: 0 hours 23 minutes 55 seconds  Findings:      The terminal ileum appeared normal.      The recto-sigmoid colon, sigmoid colon and descending colon were       significantly redundant. Biopsies for histology were taken with a cold       forceps from the cecum, transverse colon and descending colon for       evaluation of microscopic colitis.      The retroflexed view of the distal rectum and anal verge was normal and       showed no anal or rectal abnormalities. Impression:               - The examined portion of the ileum was normal.                           - Diverticulosis in the recto-sigmoid colon and in                            the sigmoid colon.                           - Redundant RECTOSIGMOID colon.                           -  The distal rectum and anal verge are normal on                            retroflexion view. Moderate Sedation:      Moderate (conscious) sedation was administered by the endoscopy nurse       and supervised by the endoscopist. The following parameters were       monitored: oxygen saturation, heart rate, blood pressure, and response       to care. Total physician intraservice time was 48 minutes. Recommendation:           - FODMAP DIET.                           - Continue present medications. IMODIUM IF NEEDED                            TO CONTROL LOOSE STOOLS.                           - Await pathology results.                           - Repeat colonoscopy in 10 years for surveillance.                           - Return to my office in 3 months.                           -  Patient has a contact number available for                            emergencies. The signs and symptoms of potential                            delayed complications were discussed with the                            patient. Return to normal activities tomorrow.                            Written discharge instructions were provided to the                            patient. Procedure Code(s):        --- Professional ---                           [phone], Colonoscopy, flexible; with biopsy, single                            or multiple                           99152, Moderate sedation services provided by the                            same physician or other qualified health care                            professional performing the diagnostic or                            therapeutic service that the sedation supports,                            requiring the presence of an independent trained                            observer to assist in the monitoring of the                            patient's level of consciousness and physiological                            status; initial 15 minutes of intraservice  time,                            patient age 74 years or older                           [phone], Moderate sedation services; each additional  15 minutes intraservice time                           99153, Moderate sedation services; each additional                            15 minutes intraservice time Diagnosis Code(s):        --- Professional ---                           R10.11, Right upper quadrant pain                           R19.7, Diarrhea, unspecified                           K57.30, Diverticulosis of large intestine without                            perforation or abscess without bleeding                           Q43.8, Other specified congenital malformations of                            intestine CPT copyright 2016 [employer]. All rights reserved. The codes documented in this report are preliminary and upon coder review may  be revised to meet current compliance requirements. Barney Drain, MD Barney Drain MD, MD 01/26/2017 12:50:37 PM This report has been signed electronically. Number of Addenda: 0

## 2017-01-27 ENCOUNTER — Other Ambulatory Visit: Payer: Self-pay | Admitting: *Deleted

## 2017-01-27 ENCOUNTER — Telehealth: Payer: Self-pay | Admitting: Gastroenterology

## 2017-01-27 DIAGNOSIS — R1011 Right upper quadrant pain: Secondary | ICD-10-CM

## 2017-01-27 NOTE — Telephone Encounter (Signed)
PLEASE CALL PT. UPON FURTHER DISCUSSION WITH RADIOLOGY SHE SHOULD HAVE AN MRCP NEXT WEEK INSTEAD OF A HIDA SCAN. I APOLOGIZE FOR ANY CONFUSION.

## 2017-01-27 NOTE — Telephone Encounter (Signed)
Spoke with pt and she is fine with schedule MRCP. MRCP scheduled for 02/03/17 at 7:00pm, arrival time 6:45pm, NPO 4 hrs prior to test. Patient insurance does not require precert for this. HIDA scan has been cancelled.

## 2017-01-27 NOTE — Progress Notes (Signed)
PT is aware and Ok to schedule the HIDA scan.

## 2017-01-28 ENCOUNTER — Encounter (HOSPITAL_COMMUNITY): Payer: Self-pay | Admitting: Gastroenterology

## 2017-02-02 ENCOUNTER — Other Ambulatory Visit (HOSPITAL_COMMUNITY): Payer: No Typology Code available for payment source

## 2017-02-03 ENCOUNTER — Encounter (HOSPITAL_COMMUNITY): Payer: Self-pay

## 2017-02-03 ENCOUNTER — Ambulatory Visit (HOSPITAL_COMMUNITY)
Admission: RE | Admit: 2017-02-03 | Discharge: 2017-02-03 | Disposition: A | Discharge status: Home / Self Care | Payer: No Typology Code available for payment source | Source: Ambulatory Visit | Attending: Gastroenterology | Admitting: Gastroenterology

## 2017-02-03 DIAGNOSIS — R1011 Right upper quadrant pain: Secondary | ICD-10-CM

## 2017-02-09 ENCOUNTER — Other Ambulatory Visit: Payer: Self-pay | Admitting: Gastroenterology

## 2017-02-09 ENCOUNTER — Ambulatory Visit (HOSPITAL_COMMUNITY)
Admission: RE | Admit: 2017-02-09 | Discharge: 2017-02-09 | Disposition: A | Discharge status: Home / Self Care | Payer: No Typology Code available for payment source | Source: Ambulatory Visit | Attending: Gastroenterology | Admitting: Gastroenterology

## 2017-02-09 DIAGNOSIS — Z9049 Acquired absence of other specified parts of digestive tract: Secondary | ICD-10-CM | POA: Diagnosis not present

## 2017-02-09 DIAGNOSIS — D1803 Hemangioma of intra-abdominal structures: Secondary | ICD-10-CM | POA: Diagnosis not present

## 2017-02-09 DIAGNOSIS — R1011 Right upper quadrant pain: Secondary | ICD-10-CM | POA: Insufficient documentation

## 2017-02-09 DIAGNOSIS — K760 Fatty (change of) liver, not elsewhere classified: Secondary | ICD-10-CM | POA: Insufficient documentation

## 2017-02-09 DIAGNOSIS — K7689 Other specified diseases of liver: Secondary | ICD-10-CM | POA: Diagnosis not present

## 2017-02-09 MED ORDER — GADOBENATE DIMEGLUMINE 529 MG/ML IV SOLN
15.0000 mL | Freq: Once | INTRAVENOUS | Status: AC | PRN
  Administered 2017-02-09: 15 mL via INTRAVENOUS

## 2017-02-10 ENCOUNTER — Encounter: Payer: Self-pay | Admitting: Gastroenterology

## 2017-02-10 NOTE — Progress Notes (Signed)
LMOM to call.

## 2017-02-10 NOTE — Progress Notes (Signed)
Made appointment with [facility]

## 2017-02-10 NOTE — Progress Notes (Signed)
Pt is aware of results and plan. She is just really worried about why she has gastritis when she doesn't drink alcohol and does not use the NSAIDS.  Her father died from liver cancer and it was diagnosed after a lot of problems. Her problems are similar to the problems he had. Almost anything she eats causes pain.   She also said she would like to see DR. Only when she comes in for a visit. She liked the PA fine. She is just so concerned about her problems and would appreciate her appts to be with DR.   Forwarding to Dr. Oneida Alar.

## 2017-02-10 NOTE — Progress Notes (Signed)
Pt returned call and said she saw Dr. Oneida Alar message and she was upset about it. Said she does not need any medicine for pain or anxiety.   She tried to send Dr. Oneida Alar an email but could not.  Said she sent Magda Paganini an email. I told her that Magda Paganini is out of the office the remainder of the day but I will let her know tomorrow that she has the email.  Pt said she will be glad to follow up with Magda Paganini.

## 2017-02-16 ENCOUNTER — Telehealth: Payer: Self-pay | Admitting: Gastroenterology

## 2017-02-16 NOTE — Telephone Encounter (Signed)
Pt is aware that Dr. Oneida Alar is on vacation and will return her call when she is back.

## 2017-02-16 NOTE — Telephone Encounter (Signed)
[phone] PATIENT CALLED AND STATED SHE WAS RETURNING [facility] CALL AND COULD BE CALLED BACK AT THIS NUMBER

## 2017-03-09 NOTE — Telephone Encounter (Signed)
Called patient TO DISCUSS CONCERNS. LVM-CALL [phone] OR [phone] TO DISCUSS.

## 2017-04-28 ENCOUNTER — Ambulatory Visit: Payer: No Typology Code available for payment source | Admitting: Gastroenterology

## 2017-04-29 ENCOUNTER — Ambulatory Visit: Payer: No Typology Code available for payment source | Admitting: Gastroenterology

## 2017-11-06 ENCOUNTER — Telehealth: Payer: Self-pay | Admitting: Gastroenterology

## 2017-11-06 NOTE — Telephone Encounter (Signed)
PT will have the pharmacy fax over info so I can contact her insurance.

## 2017-11-06 NOTE — Telephone Encounter (Signed)
Pt has questions about her Rx of Omeprazole. She said that her insurance wouldn't cover it anymore and they would need a letter from [place] saying why she needs it. She said she has used OTC Zegrid and it causes her feet and ankles to swell. Please advise and call [phone]

## 2017-11-10 NOTE — Telephone Encounter (Signed)
I called the pharmacy and they will fax the info to me.

## 2019-09-24 IMAGING — MR MR 3D RECON AT SCANNER
20 series · 20 of 20 positions shown · IV contrast (15ml Multihance)
Comparison: MR 12/12/2016, CT 07/27/2016

CLINICAL DATA: RIGHT upper quadrant pain.  No fever.

EXAM:
MRI ABDOMEN WITHOUT AND WITH CONTRAST (INCLUDING MRCP)
TECHNIQUE: Multiplanar multisequence MR imaging of the abdomen was performed
both before and after the administration of intravenous contrast.
Heavily T2-weighted images of the biliary and pancreatic ducts were
obtained, and three-dimensional MRCP images were rendered by post
processing.
CONTRAST:  15mL MULTIHANCE GADOBENATE DIMEGLUMINE 529 MG/ML IV SOLN

[Series 3: T2 · coronal · 5.0mm · 1.18mm/px · 1 of 36 slices shown (1 of 3)]
[im 1/36]
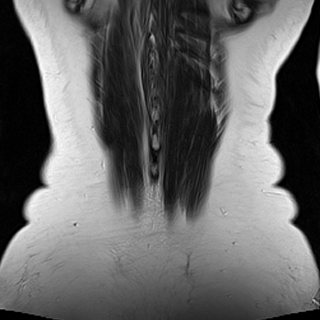

[Series 4: t2fs axial blade · axial · 4.0mm · 1.06mm/px · 1 of 48 slices shown]
[im 1/48]
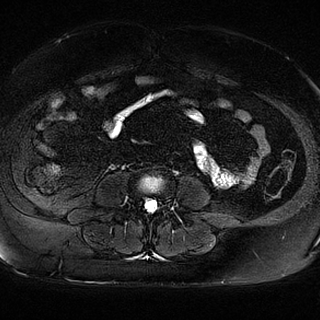

[Series 5: MRCP · coronal · 4.0mm · 0.78mm/px · 1 of 15 slices shown]
[im 1/15]
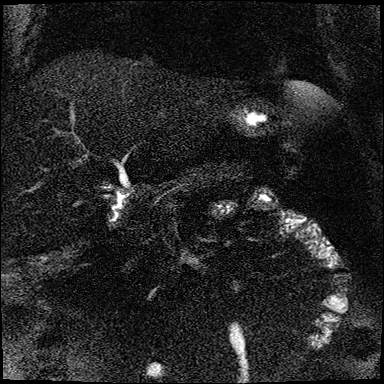

[Series 7: T2 · coronal · 1.0mm · 0.42mm/px · 1 of 112 slices shown (2 of 3)]
[im 1/112]
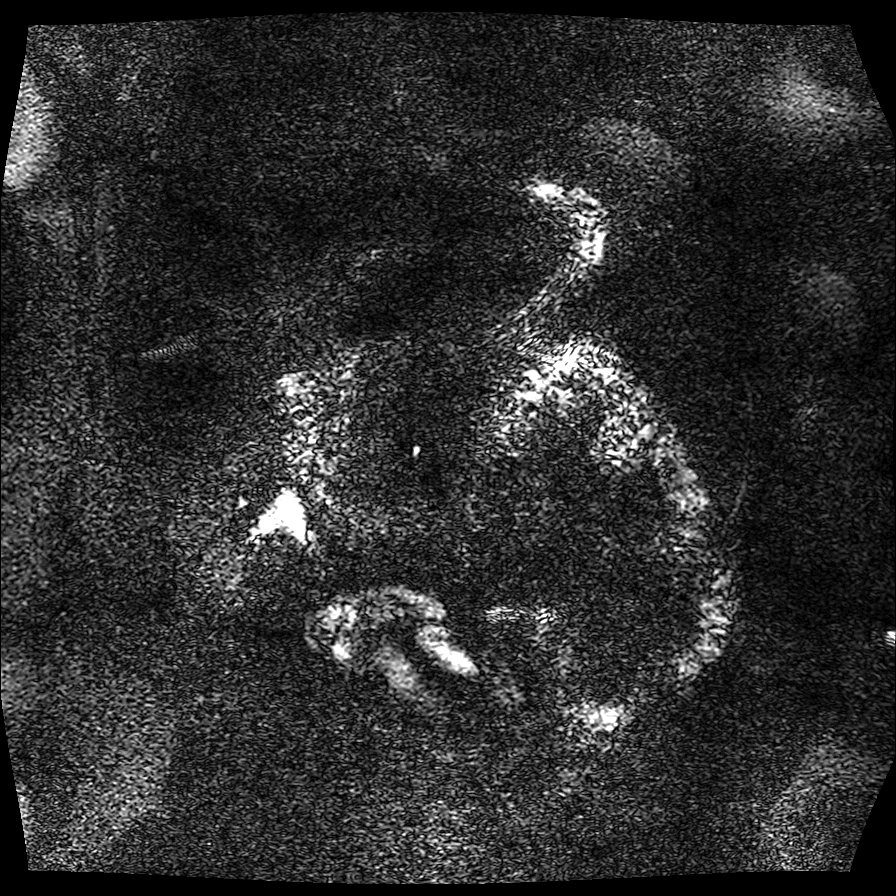

[Series 10: DWI · axial · 5.0mm · 0.88mm/px · 1 of 80 slices shown (1 of 2)]
[im 1/80]
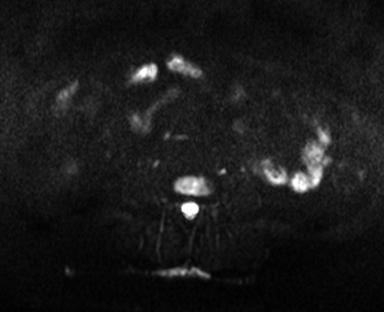

[Series 11: DWI · axial · 5.0mm · 0.88mm/px · 1 of 40 slices shown (2 of 2)]
[im 1/40]
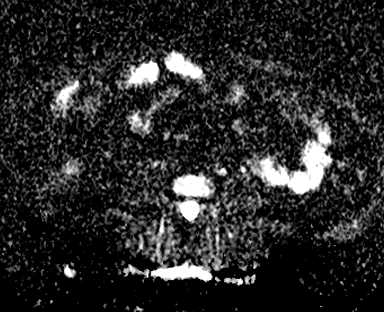

[Series 12: bSSFP · axial · 4.0mm · 1.26mm/px · 1 of 70 slices shown]
[im 1/70]
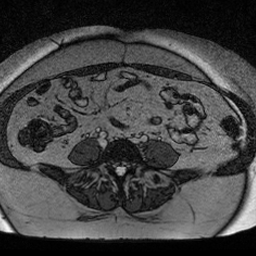

[Series 13: T1 · axial · 4.0mm · 0.59mm/px · 1 of 64 slices shown (1 of 2)]
[im 1/64]
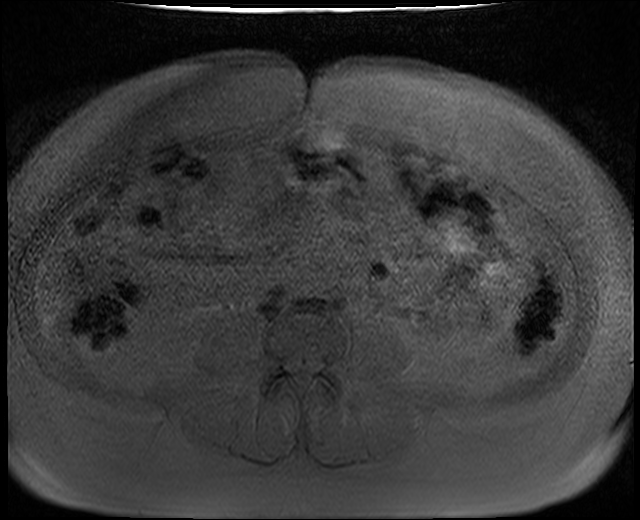

[Series 14: T1 · axial · 4.0mm · 0.59mm/px · 1 of 64 slices shown (2 of 2)]
[im 1/64]
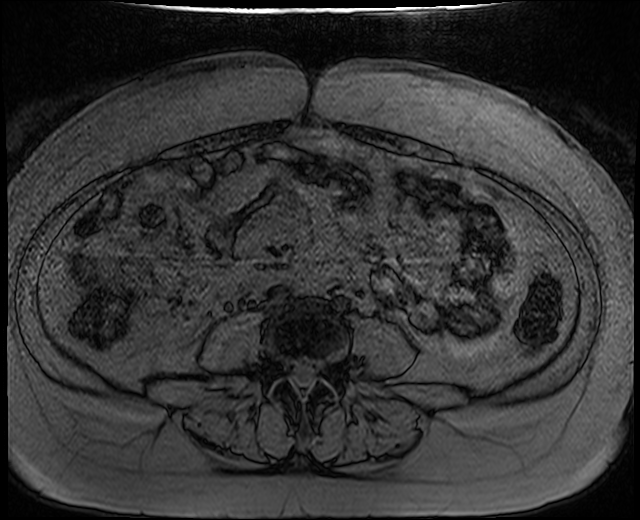

[Series 17: pre test · axial · non-contrast · 3.5mm · 1.19mm/px · 1 of 72 slices shown]
[im 1/72]
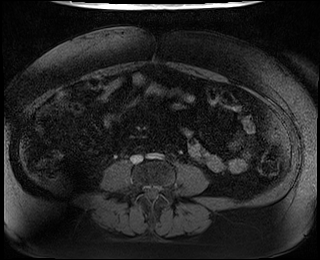

[Series 29: t1fs coronal post · coronal · 2.5mm · 1.30mm/px · 1 of 96 slices shown]
[im 1/96]
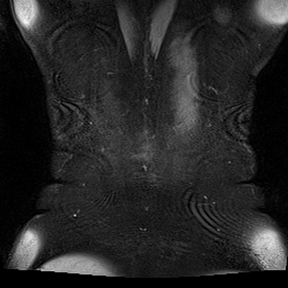

[Series 30: T2 · axial · 5.0mm · 1.36mm/px · 1 of 40 slices shown (3 of 3)]
[im 1/40]
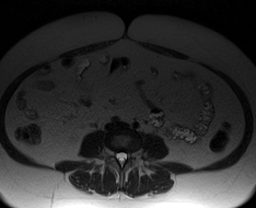

[Series 31: 5 min delay · axial · delayed · 3.5mm · 1.19mm/px · 1 of 72 slices shown]
[im 1/72]
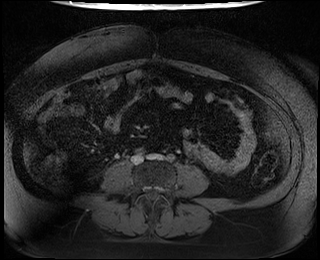

[Series 32: 5 min delay_sub · axial · 3.5mm · 1.19mm/px · 1 of 72 slices shown]
[im 1/72]
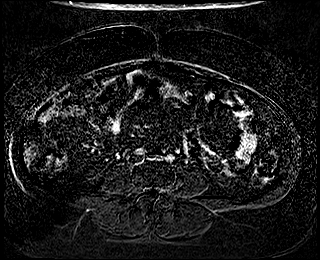

[Series 5003: (id) · axial · 3.5mm · 1.19mm/px · 1 of 72 slices shown (1 of 6)]
[im 1/72]
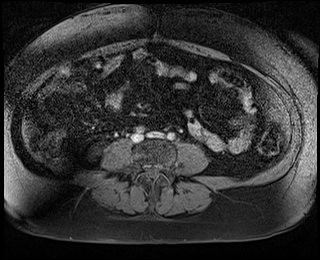

[Series 5004: (id) · axial · 3.5mm · 1.19mm/px · 1 of 72 slices shown (2 of 6)]
[im 1/72]
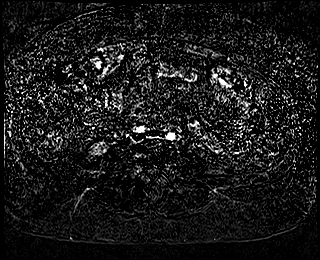

[Series 5005: (id) · axial · 3.5mm · 1.19mm/px · 1 of 72 slices shown (3 of 6)]
[im 1/72]
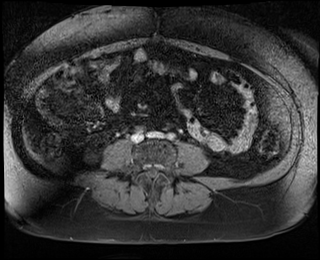

[Series 5006: (id) · axial · 3.5mm · 1.19mm/px · 1 of 72 slices shown (4 of 6)]
[im 1/72]
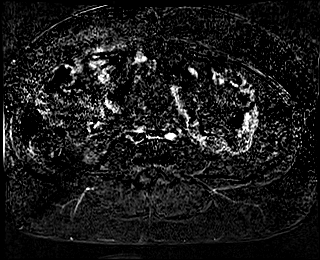

[Series 5007: (id) · axial · 3.5mm · 1.19mm/px · 1 of 72 slices shown (5 of 6)]
[im 1/72]
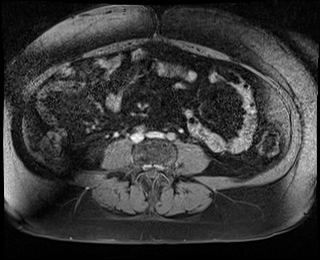

[Series 5008: (id) · axial · 3.5mm · 1.19mm/px · 1 of 72 slices shown (6 of 6)]
[im 1/72]
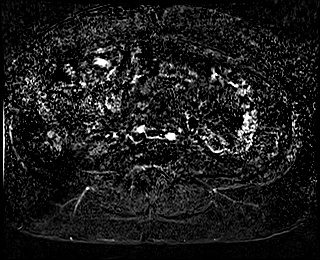

[20 of 20 positions shown; findings below may reference images not displayed]

FINDINGS: Lower chest:  Lung bases are clear.

Hepatobiliary: There is marked loss of signal intensity within the
liver parenchyma on opposed phase imaging consistent with hepatic
steatosis. Patient status post cholecystectomy. No biliary duct
dilatation. Common bile duct is normal caliber. No filling defect or
obstruction of the common bile duct.

There is a benign cysts within the RIGHT hepatic lobe without
enhancement measuring 20 mm (image 43, series 31). Flash filling
hemangioma in the posterior RIGHT hepatic lobe measures 17 mm (image
22 series 31.

Pancreas: Normal pancreatic parenchymal intensity. No ductal
dilatation or inflammation.

Spleen: Normal spleen.

Adrenals/urinary tract: Adrenal glands and kidneys are normal.

Stomach/Bowel: Stomach and limited of the small bowel is
unremarkable

Vascular/Lymphatic: Abdominal aortic normal caliber. No
retroperitoneal periportal lymphadenopathy.

Musculoskeletal: No aggressive osseous lesion
IMPRESSION: 1. Marked hepatic steatosis.
2. No acute abdominal findings.
3. Normal biliary tree post cholecystectomy.
4. Stable benign hepatic hemangioma and cyst in the RIGHT hepatic
lobe.

## 2022-05-27 ENCOUNTER — Encounter: Payer: Self-pay | Admitting: Internal Medicine

## 2022-06-17 ENCOUNTER — Ambulatory Visit: Payer: No Typology Code available for payment source | Admitting: Gastroenterology

## 2022-06-20 ENCOUNTER — Encounter: Payer: Self-pay | Admitting: Gastroenterology

## 2022-06-20 ENCOUNTER — Telehealth: Payer: Self-pay | Admitting: *Deleted

## 2022-06-20 ENCOUNTER — Encounter: Payer: Self-pay | Admitting: *Deleted

## 2022-06-20 ENCOUNTER — Ambulatory Visit (INDEPENDENT_AMBULATORY_CARE_PROVIDER_SITE_OTHER): Payer: No Typology Code available for payment source | Admitting: Gastroenterology

## 2022-06-20 VITALS — BP 130/88 | HR 76 | Temp 97.8°F | Ht 62.0 in | Wt 154.0 lb

## 2022-06-20 DIAGNOSIS — K2270 Barrett's esophagus without dysplasia: Secondary | ICD-10-CM

## 2022-06-20 DIAGNOSIS — K760 Fatty (change of) liver, not elsewhere classified: Secondary | ICD-10-CM | POA: Insufficient documentation

## 2022-06-20 DIAGNOSIS — K769 Liver disease, unspecified: Secondary | ICD-10-CM | POA: Diagnosis not present

## 2022-06-20 DIAGNOSIS — K5900 Constipation, unspecified: Secondary | ICD-10-CM | POA: Diagnosis not present

## 2022-06-20 MED ORDER — OMEPRAZOLE 20 MG PO CPDR: 20.0000 mg | DELAYED_RELEASE_CAPSULE | Freq: Every day | ORAL | 3 refills | Status: DC

## 2022-06-20 NOTE — Telephone Encounter (Signed)
PA submitted via [employer].

## 2022-06-20 NOTE — H&P (View-Only) (Signed)
   GI Office Note    Referring Provider: Elliott, Dianne E, FNP Primary Care Physician:  Elliott, Dianne E  Primary Gastroenterologist: formerly Dr. Fields  Chief Complaint   Chief Complaint  Patient presents with   Colonoscopy    Here to schedule colonoscopy/EGD Barrett's     History of Present Illness   Jessica Le is a 57 y.o. female presenting today for left request of Diane Elliott, FNP for EGD and colonoscopy.   Seen remotely in 2018 for RUQ pain and liver lesions. She has history of fatty liver. She also gives history of Barrett's esophagus diagnosed at outside facility. FH liver cancer (father deceased in his 40s). Her workup in 2018 included celiac serologies which were negative.CT abdomen and pelvis with and without contrast dated 10/01/2016, compared to November 2014 study 2 low-density space-occupying lesions noted in the liver, unchanged from prior study, probably cyst which measured 2.1 cm and 1.5 cm in diameter. She had a CT abdomen back in 2009 at our facility, posterior right hepatic lobe 1.7 cm lesion with peripheral enhancement and possible nodular enhancement. Another right hepatic lobe lesion measuring 4 mm to smaller evaluate. MRI recommended at the time. See below.  Today: weight is down 30 pounds in the last one year.  Initially lost some weight around Labor Day last year when she and her husband at GI illness. She has severe diarrhea for several days prompted EMS to take her for evaluation due to dehydration. She states she could not eat normal for several weeks afterwards. After that she continued with light more healthy diet. She states her cholesterol better since weight loss. She feels better at lighter weight. Always tendency towards constipation. Usually BM 2 per week. No melena, brbpr. Stools start hard and end up diarrhea. No heartburn. No dysphagia. No n/v.  Colonoscopy December 2018: -Examined portion of ileum normal -Diverticulosis in the  rectosigmoid colon and in the sigmoid colon -Redundant rectosigmoid colon -Distal rectum and anal verge normal -Random colon biopsies negative -next colonoscopy due in 2028  EGD December 2018 -Esophageal mucosal changes consistent with short segment Barrett's esophagus, on biopsy showed reflux esophagitis, no findings consistent with Barrett's -Few gastric polyps resected -Medium sized hiatal hernia -Gastritis, no H. Pylori -Duodenitis on path  MRI liver with and without contrast October 2018 for indeterminate liver lesions: IMPRESSION: -Small benign hepatic hemangioma, cyst, and focal nodular hyperplasia. No evidence of malignancy. -Moderate to severe hepatic steatosis. -No follow-up needed for liver lesions per Dr. Fields.  MRCP/MRI abdomen with and without contrast December 2018 for right upper quadrant pain: IMPRESSION: 1. Marked hepatic steatosis. 2. No acute abdominal findings. 3. Normal biliary tree post cholecystectomy. 4. Stable benign hepatic hemangioma and cyst in the RIGHT hepatic lobe.  Medications   Current Outpatient Medications  Medication Sig Dispense Refill   Ascorbic Acid (VITAMIN C) 1000 MG tablet Take 1,000 mg by mouth daily.     estradiol (VIVELLE-DOT) 0.1 MG/24HR patch Place 1 patch onto the skin 2 (two) times a week. TYPICALLY (Sunday & Wednesday)     Multiple Vitamin (MULTIVITAMIN WITH MINERALS) TABS tablet Take 1 tablet by mouth daily. WOMEN'S ONE A DAY     omeprazole (PRILOSEC OTC) 20 MG tablet      rosuvastatin (CRESTOR) 10 MG tablet take 1 tablet once a day by mouth.     No current facility-administered medications for this visit.    Allergies   Allergies as of 06/20/2022 - Review Complete 06/20/2022  Allergen   Reaction Noted   Latex Anaphylaxis 11/28/2016   Vancomycin Anaphylaxis 11/28/2016   Ciprofloxacin  01/26/2017   Codeine Nausea Only 06/20/2022   Erythromycin Nausea Only 06/20/2022   Levaquin [levofloxacin in d5w]  01/26/2017    Phenergan [promethazine hcl] Other (See Comments) 01/22/2017   Sulfa antibiotics  11/28/2016    Past Medical History   Past Medical History:  Diagnosis Date   ADHD    Anxiety    Barrett's esophagus    GERD (gastroesophageal reflux disease)    RA (rheumatoid arthritis) (HCC)    Sleep apnea     Past Surgical History   Past Surgical History:  Procedure Laterality Date   APPENDECTOMY     Bilateral myringotomy and tube placement  2018   CESAREAN SECTION     CHOLECYSTECTOMY     COLONOSCOPY  2014   COLONOSCOPY N/A 01/26/2017   Procedure: COLONOSCOPY;  Surgeon: Fields, Sandi L, MD;  Location: [facility];  Service: Endoscopy;  Laterality: N/A;  11:15am   ESOPHAGOGASTRODUODENOSCOPY  2011   ESOPHAGOGASTRODUODENOSCOPY N/A 01/26/2017   Procedure: ESOPHAGOGASTRODUODENOSCOPY (EGD);  Surgeon: Fields, Sandi L, MD;  Location: [facility];  Service: Endoscopy;  Laterality: N/A;   HERNIA REPAIR     incisional hernia   Left ulnar surgery     OOPHORECTOMY     TONSILLECTOMY      Past Family History   Family History  Problem Relation Age of Onset   Liver cancer Father    Colon cancer Neg Hx     Past Social History   Social History   Socioeconomic History   Marital status: Married    Spouse name: Not on file   Number of children: Not on file   Years of education: Not on file   Highest education level: Not on file  Occupational History   Not on file  Tobacco Use   Smoking status: Never   Smokeless tobacco: Never  Vaping Use   Vaping Use: Never used  Substance and Sexual Activity   Alcohol use: No   Drug use: No   Sexual activity: Yes  Other Topics Concern   Not on file  Social History Narrative   Not on file   Social Determinants of Health   Financial Resource Strain: Not on file  Food Insecurity: Not on file  Transportation Needs: Not on file  Physical Activity: Not on file  Stress: Not on file  Social Connections: Not on file  Intimate Partner Violence: Not  on file    Review of Systems   General: Negative for anorexia, weight loss, fever, chills, fatigue, weakness. Eyes: Negative for vision changes.  ENT: Negative for hoarseness, difficulty swallowing , nasal congestion. CV: Negative for chest pain, angina, palpitations, dyspnea on exertion, peripheral edema.  Respiratory: Negative for dyspnea at rest, dyspnea on exertion, cough, sputum, wheezing.  GI: See history of present illness. GU:  Negative for dysuria, hematuria, urinary incontinence, urinary frequency, nocturnal urination.  MS: + for joint pain. No low back pain.  Derm: Negative for rash or itching.  Neuro: Negative for weakness, abnormal sensation, seizure, frequent headaches, memory loss,  confusion.  Psych: Negative for anxiety, depression, suicidal ideation, hallucinations.  Endo: Negative for unusual weight change.  Heme: Negative for bruising or bleeding. Allergy: Negative for rash or hives.  Physical Exam   BP (!) 145/91 (BP Location: Right Arm, Patient Position: Sitting, Cuff Size: Normal)   Pulse 76   Temp 97.8 F (36.6 C) (Oral)   Ht   5' 2" (1.575 m)   Wt 154 lb (69.9 kg)   LMP 09/28/2016 Comment: very few periods  SpO2 99%   BMI 28.17 kg/m    General: Well-nourished, well-developed in no acute distress.  Head: Normocephalic, atraumatic.   Eyes: Conjunctiva pink, no icterus. Mouth: Oropharyngeal mucosa moist and pink  Neck: Supple without thyromegaly, masses, or lymphadenopathy.  Lungs: Clear to auscultation bilaterally.  Heart: Regular rate and rhythm, no murmurs rubs or gallops.  Abdomen: Bowel sounds are normal, nontender, nondistended, no hepatosplenomegaly or masses,  no abdominal bruits or hernia, no rebound or guarding.   Rectal: not performed Extremities: No lower extremity edema. No clubbing or deformities.  Neuro: Alert and oriented x 4 , grossly normal neurologically.  Skin: Warm and dry, no rash or jaundice.   Psych: Alert and cooperative,  normal mood and affect.  Labs   None available  Imaging Studies   No results found.  Assessment   GERD/Barrett's: diagnosed with Barrett's previously by Dr. Shiflett in [place]. Last EGD here in 2018 with endoscopic appearance of short segment Barrett's but not confirmed on path. Given initial diagnosis and endoscopic appearance it would be reasonable to update EGD with biopsies at this time.   Constipation: poorly controlled  Liver lesions: as noted above, history of benign appearing hepatic cysts, hemangioma, and focal nodular hyperplasia. She reports more recent imaging at outside facility. We will request records for review.  Hepatic steatosis: review more recent imaging. Congratulated her on her weight loss. She should limit etoh use, control cholesterol, check LFTs twice yearly.   Colon cancer screening; she is up to date on colonoscopy with last one in 2018. Not due until 2028 unless she develops new onset symptoms.   PLAN   Omeprazole 20mg daily. Miralax one capful daily.  Requested copy of prior imaging from [facility].  EGD in near future. ASA 2.  I have discussed the risks, alternatives, benefits with regards to but not limited to the risk of reaction to medication, bleeding, infection, perforation and the patient is agreeable to proceed. Written consent to be obtained.   Adelynn Gipe S. Ada Holness, MHS, PA-C [facility]  

## 2022-06-20 NOTE — Progress Notes (Signed)
GI Office Note    Referring Provider: Garald Braver, FNP Primary Care Physician:  Garald Braver  Primary Gastroenterologist: formerly Dr. Darrick Penna  Chief Complaint   Chief Complaint  Patient presents with   Colonoscopy    Here to schedule colonoscopy/EGD Barrett's     History of Present Illness   Jessica Le is a 57 y.o. female presenting today for left request of Nancy Nordmann, FNP for EGD and colonoscopy.   Seen remotely in 2018 for RUQ pain and liver lesions. She has history of fatty liver. She also gives history of Barrett's esophagus diagnosed at outside facility. FH liver cancer (father deceased in his 74s). Her workup in 2018 included celiac serologies which were negative.CT abdomen and pelvis with and without contrast dated 10/01/2016, compared to November 2014 study 2 low-density space-occupying lesions noted in the liver, unchanged from prior study, probably cyst which measured 2.1 cm and 1.5 cm in diameter. She had a CT abdomen back in 2009 at our facility, posterior right hepatic lobe 1.7 cm lesion with peripheral enhancement and possible nodular enhancement. Another right hepatic lobe lesion measuring 4 mm to smaller evaluate. MRI recommended at the time. See below.  Today: weight is down 30 pounds in the last one year.  Initially lost some weight around Labor Day last year when she and her husband at GI illness. She has severe diarrhea for several days prompted EMS to take her for evaluation due to dehydration. She states she could not eat normal for several weeks afterwards. After that she continued with light more healthy diet. She states her cholesterol better since weight loss. She feels better at lighter weight. Always tendency towards constipation. Usually BM 2 per week. No melena, brbpr. Stools start hard and end up diarrhea. No heartburn. No dysphagia. No n/v.  Colonoscopy December 2018: -Examined portion of ileum normal -Diverticulosis in the  rectosigmoid colon and in the sigmoid colon -Redundant rectosigmoid colon -Distal rectum and anal verge normal -Random colon biopsies negative -next colonoscopy due in 2028  EGD December 2018 -Esophageal mucosal changes consistent with short segment Barrett's esophagus, on biopsy showed reflux esophagitis, no findings consistent with Barrett's -Few gastric polyps resected -Medium sized hiatal hernia -Gastritis, no H. Pylori -Duodenitis on path  MRI liver with and without contrast October 2018 for indeterminate liver lesions: IMPRESSION: -Small benign hepatic hemangioma, cyst, and focal nodular hyperplasia. No evidence of malignancy. -Moderate to severe hepatic steatosis. -No follow-up needed for liver lesions per Dr. Darrick Penna.  MRCP/MRI abdomen with and without contrast December 2018 for right upper quadrant pain: IMPRESSION: 1. Marked hepatic steatosis. 2. No acute abdominal findings. 3. Normal biliary tree post cholecystectomy. 4. Stable benign hepatic hemangioma and cyst in the RIGHT hepatic lobe.  Medications   Current Outpatient Medications  Medication Sig Dispense Refill   Ascorbic Acid (VITAMIN C) 1000 MG tablet Take 1,000 mg by mouth daily.     estradiol (VIVELLE-DOT) 0.1 MG/24HR patch Place 1 patch onto the skin 2 (two) times a week. TYPICALLY (Sunday & Wednesday)     Multiple Vitamin (MULTIVITAMIN WITH MINERALS) TABS tablet Take 1 tablet by mouth daily. WOMEN'S ONE A DAY     omeprazole (PRILOSEC OTC) 20 MG tablet      rosuvastatin (CRESTOR) 10 MG tablet take 1 tablet once a day by mouth.     No current facility-administered medications for this visit.    Allergies   Allergies as of 06/20/2022 - Review Complete 06/20/2022  Allergen  Reaction Noted   Latex Anaphylaxis 11/28/2016   Vancomycin Anaphylaxis 11/28/2016   Ciprofloxacin  01/26/2017   Codeine Nausea Only 06/20/2022   Erythromycin Nausea Only 06/20/2022   Levaquin [levofloxacin in d5w]  01/26/2017    Phenergan [promethazine hcl] Other (See Comments) 01/22/2017   Sulfa antibiotics  11/28/2016    Past Medical History   Past Medical History:  Diagnosis Date   ADHD    Anxiety    Barrett's esophagus    GERD (gastroesophageal reflux disease)    RA (rheumatoid arthritis) (HCC)    Sleep apnea     Past Surgical History   Past Surgical History:  Procedure Laterality Date   APPENDECTOMY     Bilateral myringotomy and tube placement  2018   CESAREAN SECTION     CHOLECYSTECTOMY     COLONOSCOPY  2014   COLONOSCOPY N/A 01/26/2017   Procedure: COLONOSCOPY;  Surgeon: West Bali, MD;  Location: [facility];  Service: Endoscopy;  Laterality: N/A;  11:15am   ESOPHAGOGASTRODUODENOSCOPY  2011   ESOPHAGOGASTRODUODENOSCOPY N/A 01/26/2017   Procedure: ESOPHAGOGASTRODUODENOSCOPY (EGD);  Surgeon: West Bali, MD;  Location: [facility];  Service: Endoscopy;  Laterality: N/A;   HERNIA REPAIR     incisional hernia   Left ulnar surgery     OOPHORECTOMY     TONSILLECTOMY      Past Family History   Family History  Problem Relation Age of Onset   Liver cancer Father    Colon cancer Neg Hx     Past Social History   Social History   Socioeconomic History   Marital status: Married    Spouse name: Not on file   Number of children: Not on file   Years of education: Not on file   Highest education level: Not on file  Occupational History   Not on file  Tobacco Use   Smoking status: Never   Smokeless tobacco: Never  Vaping Use   Vaping Use: Never used  Substance and Sexual Activity   Alcohol use: No   Drug use: No   Sexual activity: Yes  Other Topics Concern   Not on file  Social History Narrative   Not on file   Social Determinants of Health   Financial Resource Strain: Not on file  Food Insecurity: Not on file  Transportation Needs: Not on file  Physical Activity: Not on file  Stress: Not on file  Social Connections: Not on file  Intimate Partner Violence: Not  on file    Review of Systems   General: Negative for anorexia, weight loss, fever, chills, fatigue, weakness. Eyes: Negative for vision changes.  ENT: Negative for hoarseness, difficulty swallowing , nasal congestion. CV: Negative for chest pain, angina, palpitations, dyspnea on exertion, peripheral edema.  Respiratory: Negative for dyspnea at rest, dyspnea on exertion, cough, sputum, wheezing.  GI: See history of present illness. GU:  Negative for dysuria, hematuria, urinary incontinence, urinary frequency, nocturnal urination.  MS: + for joint pain. No low back pain.  Derm: Negative for rash or itching.  Neuro: Negative for weakness, abnormal sensation, seizure, frequent headaches, memory loss,  confusion.  Psych: Negative for anxiety, depression, suicidal ideation, hallucinations.  Endo: Negative for unusual weight change.  Heme: Negative for bruising or bleeding. Allergy: Negative for rash or hives.  Physical Exam   BP (!) 145/91 (BP Location: Right Arm, Patient Position: Sitting, Cuff Size: Normal)   Pulse 76   Temp 97.8 F (36.6 C) (Oral)   Ht  5\' 2"  (1.575 m)   Wt 154 lb (69.9 kg)   LMP 09/28/2016 Comment: very few periods  SpO2 99%   BMI 28.17 kg/m    General: Well-nourished, well-developed in no acute distress.  Head: Normocephalic, atraumatic.   Eyes: Conjunctiva pink, no icterus. Mouth: Oropharyngeal mucosa moist and pink  Neck: Supple without thyromegaly, masses, or lymphadenopathy.  Lungs: Clear to auscultation bilaterally.  Heart: Regular rate and rhythm, no murmurs rubs or gallops.  Abdomen: Bowel sounds are normal, nontender, nondistended, no hepatosplenomegaly or masses,  no abdominal bruits or hernia, no rebound or guarding.   Rectal: not performed Extremities: No lower extremity edema. No clubbing or deformities.  Neuro: Alert and oriented x 4 , grossly normal neurologically.  Skin: Warm and dry, no rash or jaundice.   Psych: Alert and cooperative,  normal mood and affect.  Labs   None available  Imaging Studies   No results found.  Assessment   GERD/Barrett's: diagnosed with Barrett's previously by Dr. Elder Cyphers in [place]. Last EGD here in 2018 with endoscopic appearance of short segment Barrett's but not confirmed on path. Given initial diagnosis and endoscopic appearance it would be reasonable to update EGD with biopsies at this time.   Constipation: poorly controlled  Liver lesions: as noted above, history of benign appearing hepatic cysts, hemangioma, and focal nodular hyperplasia. She reports more recent imaging at outside facility. We will request records for review.  Hepatic steatosis: review more recent imaging. Congratulated her on her weight loss. She should limit etoh use, control cholesterol, check LFTs twice yearly.   Colon cancer screening; she is up to date on colonoscopy with last one in 2018. Not due until 2028 unless she develops new onset symptoms.   PLAN   Omeprazole 20mg  daily. Miralax one capful daily.  Requested copy of prior imaging from [facility] and [facility].  EGD in near future. ASA 2.  I have discussed the risks, alternatives, benefits with regards to but not limited to the risk of reaction to medication, bleeding, infection, perforation and the patient is agreeable to proceed. Written consent to be obtained.   Leanna Battles. Melvyn Neth, MHS, PA-C [facility]

## 2022-06-20 NOTE — Telephone Encounter (Signed)
Spoke with Melanie in endo and explained pt situation with her cycle. She stated pt would not need  UPT prior

## 2022-06-20 NOTE — Patient Instructions (Signed)
RX for omeprazole 20mg  daily sent in to [facility]. Try Miralax one capful daily for constipation. You should take a dose if no bowel movement for 24 hours.  I will request copy of your records for review but if you are able to upload in Mychart that would be great! Upper endoscopy to be scheduled. See separate instructions.

## 2022-06-23 ENCOUNTER — Encounter: Payer: Self-pay | Admitting: Gastroenterology

## 2022-06-24 NOTE — Telephone Encounter (Signed)
PA approved. Auth# [id number] DOS 07/18/22-08/18/22

## 2022-07-03 ENCOUNTER — Telehealth: Payer: Self-pay | Admitting: Gastroenterology

## 2022-07-03 NOTE — Telephone Encounter (Signed)
Reviewed records:   CT abdomen pelvis without contrast May 09, 2022: 2.9 cm hypodensity in the liver appears stable and likely cyst.  CT abdomen pelvis with IV contrast October 27, 2021: Fatty infiltration of the liver, probable cyst at the posterior segment of the right hepatic lobe, 3 cm.  Also had air-fluid levels at nondistended bowel, consistent with enterocolitis.  Nonobstructing bilateral nephrolithiasis, stones measuring up to 5 mm.  CT abdomen pelvis without contrast November 2022: 2.8 cm bilobed cystic density lesion within the inferior right hepatic lobe most likely flex cyst or hamartoma.  No worrisome liver lesion identified.  Labs from May 08, 2022: Blood cell count 9390, hemoglobin 13.4, platelets 380,000, BUN 14, creatinine 0.75, glucose 87, albumin 3.8, total bilirubin 0.6, AST 19, ALT 24, alk phos 68, A1c 5.2, TSH 0.97, free T4 0.87

## 2022-07-15 ENCOUNTER — Encounter (HOSPITAL_COMMUNITY): Payer: Self-pay

## 2022-07-15 ENCOUNTER — Encounter (HOSPITAL_COMMUNITY)
Admission: RE | Admit: 2022-07-15 | Discharge: 2022-07-15 | Disposition: A | Discharge status: Home / Self Care | Payer: No Typology Code available for payment source | Source: Ambulatory Visit | Attending: Internal Medicine | Admitting: Internal Medicine

## 2022-07-16 ENCOUNTER — Other Ambulatory Visit: Payer: Self-pay

## 2022-07-16 ENCOUNTER — Telehealth: Payer: Self-pay | Admitting: Gastroenterology

## 2022-07-16 DIAGNOSIS — K760 Fatty (change of) liver, not elsewhere classified: Secondary | ICD-10-CM

## 2022-07-16 NOTE — Telephone Encounter (Signed)
Correction:2025

## 2022-07-16 NOTE — Telephone Encounter (Signed)
Please plan for LFTs in 04/2023. Dx: fatty liver

## 2022-07-16 NOTE — Telephone Encounter (Signed)
Labs were ordered and will be mailed to the pt in 04/3023

## 2022-07-18 ENCOUNTER — Encounter (HOSPITAL_COMMUNITY): Payer: Self-pay

## 2022-07-18 ENCOUNTER — Ambulatory Visit (HOSPITAL_COMMUNITY)
Admission: RE | Admit: 2022-07-18 | Discharge: 2022-07-18 | Disposition: A | Discharge status: Home / Self Care | Payer: No Typology Code available for payment source | Attending: Internal Medicine | Admitting: Internal Medicine

## 2022-07-18 ENCOUNTER — Ambulatory Visit (HOSPITAL_BASED_OUTPATIENT_CLINIC_OR_DEPARTMENT_OTHER): Payer: No Typology Code available for payment source | Admitting: Anesthesiology

## 2022-07-18 ENCOUNTER — Encounter (HOSPITAL_COMMUNITY)
Admission: RE | Disposition: A | Discharge status: Home / Self Care | Payer: Self-pay | Source: Home / Self Care | Attending: Internal Medicine

## 2022-07-18 ENCOUNTER — Ambulatory Visit (HOSPITAL_COMMUNITY): Payer: No Typology Code available for payment source | Admitting: Anesthesiology

## 2022-07-18 DIAGNOSIS — G4733 Obstructive sleep apnea (adult) (pediatric): Secondary | ICD-10-CM | POA: Diagnosis not present

## 2022-07-18 DIAGNOSIS — K5900 Constipation, unspecified: Secondary | ICD-10-CM | POA: Insufficient documentation

## 2022-07-18 DIAGNOSIS — K2980 Duodenitis without bleeding: Secondary | ICD-10-CM

## 2022-07-18 DIAGNOSIS — K2100 Gastro-esophageal reflux disease with esophagitis, without bleeding: Secondary | ICD-10-CM | POA: Diagnosis present

## 2022-07-18 DIAGNOSIS — K449 Diaphragmatic hernia without obstruction or gangrene: Secondary | ICD-10-CM | POA: Diagnosis not present

## 2022-07-18 DIAGNOSIS — Z800 Family history of malignant neoplasm of digestive organs: Secondary | ICD-10-CM | POA: Insufficient documentation

## 2022-07-18 DIAGNOSIS — Z9989 Dependence on other enabling machines and devices: Secondary | ICD-10-CM

## 2022-07-18 DIAGNOSIS — G4730 Sleep apnea, unspecified: Secondary | ICD-10-CM | POA: Insufficient documentation

## 2022-07-18 DIAGNOSIS — K2270 Barrett's esophagus without dysplasia: Secondary | ICD-10-CM

## 2022-07-18 DIAGNOSIS — K760 Fatty (change of) liver, not elsewhere classified: Secondary | ICD-10-CM | POA: Insufficient documentation

## 2022-07-18 HISTORY — PX: BIOPSY: SHX5522

## 2022-07-18 HISTORY — PX: ESOPHAGOGASTRODUODENOSCOPY (EGD) WITH PROPOFOL: SHX5813

## 2022-07-18 SURGERY — ESOPHAGOGASTRODUODENOSCOPY (EGD) WITH PROPOFOL
Anesthesia: General

## 2022-07-18 MED ORDER — LIDOCAINE HCL (CARDIAC) PF 100 MG/5ML IV SOSY
PREFILLED_SYRINGE | INTRAVENOUS | Status: DC | PRN
  Administered 2022-07-18: 50 mg via INTRAVENOUS

## 2022-07-18 MED ORDER — PROPOFOL 10 MG/ML IV BOLUS
INTRAVENOUS | Status: DC | PRN
  Administered 2022-07-18: 100 mg via INTRAVENOUS
  Administered 2022-07-18: 50 mg via INTRAVENOUS

## 2022-07-18 MED ORDER — LACTATED RINGERS IV SOLN: INTRAVENOUS | Status: DC

## 2022-07-18 NOTE — Anesthesia Postprocedure Evaluation (Signed)
Anesthesia Post Note  Patient: Paelynn Moffit Gruver  Procedure(s) Performed: ESOPHAGOGASTRODUODENOSCOPY (EGD) WITH PROPOFOL BIOPSY  Patient location during evaluation: Phase II Anesthesia Type: General Level of consciousness: awake and alert and oriented Pain management: pain level controlled Vital Signs Assessment: post-procedure vital signs reviewed and stable Respiratory status: spontaneous breathing, nonlabored ventilation and respiratory function stable Cardiovascular status: blood pressure returned to baseline and stable Postop Assessment: no apparent nausea or vomiting Anesthetic complications: no  No notable events documented.   Last Vitals:  Vitals:   07/18/22 0943 07/18/22 1109  BP: 120/71 112/64  Pulse: 90 78  Resp: 15 16  Temp: 36.6 C 36.8 C  SpO2: 100% 95%    Last Pain:  Vitals:   07/18/22 1109  TempSrc: Axillary  PainSc: 0-No pain                 Tayla Panozzo C Brycelyn Gambino

## 2022-07-18 NOTE — Interval H&P Note (Signed)
History and Physical Interval Note:  07/18/2022 10:14 AM  Jessica Le  has presented today for surgery, with the diagnosis of h/o barrett's.  The various methods of treatment have been discussed with the patient and family. After consideration of risks, benefits and other options for treatment, the patient has consented to  Procedure(s) with comments: ESOPHAGOGASTRODUODENOSCOPY (EGD) WITH PROPOFOL (N/A) - 1:00pm, asa 2, pt knows to arrive at 8:30 as a surgical intervention.  The patient's history has been reviewed, patient examined, no change in status, stable for surgery.  I have reviewed the patient's chart and labs.  Questions were answered to the patient's satisfaction.     Lanelle Bal

## 2022-07-18 NOTE — Discharge Instructions (Addendum)
EGD Discharge instructions Please read the instructions outlined below and refer to this sheet in the next few weeks. These discharge instructions provide you with general information on caring for yourself after you leave the hospital. Your doctor may also give you specific instructions. While your treatment has been planned according to the most current medical practices available, unavoidable complications occasionally occur. If you have any problems or questions after discharge, please call your doctor. ACTIVITY You may resume your regular activity but move at a slower pace for the next 24 hours.  Take frequent rest periods for the next 24 hours.  Walking will help expel (get rid of) the air and reduce the bloated feeling in your abdomen.  No driving for 24 hours (because of the anesthesia (medicine) used during the test).  You may shower.  Do not sign any important legal documents or operate any machinery for 24 hours (because of the anesthesia used during the test).  NUTRITION Drink plenty of fluids.  You may resume your normal diet.  Begin with a light meal and progress to your normal diet.  Avoid alcoholic beverages for 24 hours or as instructed by your caregiver.  MEDICATIONS You may resume your normal medications unless your caregiver tells you otherwise.  WHAT YOU CAN EXPECT TODAY You may experience abdominal discomfort such as a feeling of fullness or "gas" pains.  FOLLOW-UP Your doctor will discuss the results of your test with you.  SEEK IMMEDIATE MEDICAL ATTENTION IF ANY OF THE FOLLOWING OCCUR: Excessive nausea (feeling sick to your stomach) and/or vomiting.  Severe abdominal pain and distention (swelling).  Trouble swallowing.  Temperature over 101 F (37.8 C).  Rectal bleeding or vomiting of blood.   Your upper endoscopy revealed medium size hiatal hernia.  Evidence of mild acid reflux was last sampled to rule out Barrett's esophagus.  Stomach appeared normal.  Mild  inflammation in your small bowel which I also took samples of.  Continue on omeprazole daily.  Await pathology results, my office will contact you.  Follow-up with GI in 3 months   I hope you have a great rest of your week!  Jessica Le. Marletta Lor, D.O. Gastroenterology and Hepatology [facility]

## 2022-07-18 NOTE — Op Note (Signed)
[facility] Patient Name: Jessica Le Procedure Date: 07/18/2022 10:48 AM MRN: [id number] Date of Birth: [birth year] Attending MD: Hennie Duos. Marletta Lor , [place], [id number] CSN: [id number] Age: 57 Admit Type: Outpatient Procedure:                Upper GI endoscopy Indications:              Follow-up of gastro-esophageal reflux disease Providers:                Hennie Duos. Marletta Lor, DO, Nena Polio, RN, Lennice Sites                            Technician, Technician Referring MD:              Medicines:                See the Anesthesia note for documentation of the                            administered medications Complications:            No immediate complications. Estimated Blood Loss:     Estimated blood loss was minimal. Procedure:                Pre-Anesthesia Assessment:                           - The anesthesia plan was to use monitored                            anesthesia care (MAC).                           After obtaining informed consent, the endoscope was                            passed under direct vision. Throughout the                            procedure, the patient's blood pressure, pulse, and                            oxygen saturations were monitored continuously. The                            [id number]-H190 ([id number]) scope was introduced through the                            mouth, and advanced to the second part of duodenum.                            The upper GI endoscopy was accomplished without                            difficulty. The patient tolerated the procedure                            well.  Scope In: 11:01:48 AM Scope Out: 11:05:42 AM Total Procedure Duration: 0 hours 3 minutes 54 seconds  Findings:      A medium-sized hiatal hernia was present.      Mildly severe esophagitis with no bleeding was found at the       gastroesophageal junction. Biopsies were taken with a cold forceps for       histology.      Patchy mild inflammation characterized by  erosions and erythema was       found in the duodenal bulb and in the second portion of the duodenum.       Biopsies were taken with a cold forceps for histology.      The entire examined stomach was normal. Impression:               - Medium-sized hiatal hernia.                           - Mildly severe reflux esophagitis with no                            bleeding. Biopsied.                           - Duodenitis. Biopsied.                           - Normal stomach. Moderate Sedation:      Per Anesthesia Care Recommendation:           - Patient has a contact number available for                            emergencies. The signs and symptoms of potential                            delayed complications were discussed with the                            patient. Return to normal activities tomorrow.                            Written discharge instructions were provided to the                            patient.                           - Resume previous diet.                           - Continue present medications.                           - Await pathology results.                           - Use a proton pump inhibitor PO daily.                           -  Return to GI clinic in 3 months. Procedure Code(s):        --- Professional ---                           [phone], Esophagogastroduodenoscopy, flexible,                            transoral; with biopsy, single or multiple Diagnosis Code(s):        --- Professional ---                           K44.9, Diaphragmatic hernia without obstruction or                            gangrene                           K21.00, Gastro-esophageal reflux disease with                            esophagitis, without bleeding                           K29.80, Duodenitis without bleeding CPT copyright 2022 American Medical Association. All rights reserved. The codes documented in this report are preliminary and upon coder review may  be revised to  meet current compliance requirements. Hennie Duos. Marletta Lor, DO Hennie Duos. Marletta Lor, DO 07/18/2022 11:10:27 AM This report has been signed electronically. Number of Addenda: 0

## 2022-07-18 NOTE — Anesthesia Preprocedure Evaluation (Signed)
Anesthesia Evaluation  Patient identified by MRN, date of birth, ID band Patient awake    Reviewed: Allergy & Precautions, H&P , NPO status , Patient's Chart, lab work & pertinent test results  Airway Mallampati: III  TM Distance: >3 FB Neck ROM: Full    Dental no notable dental hx. (+) Teeth Intact, Dental Advisory Given   Pulmonary sleep apnea and Continuous Positive Airway Pressure Ventilation    Pulmonary exam normal breath sounds clear to auscultation       Cardiovascular Exercise Tolerance: Good negative cardio ROS Normal cardiovascular exam Rhythm:Regular Rate:Normal     Neuro/Psych  PSYCHIATRIC DISORDERS Anxiety     negative neurological ROS     GI/Hepatic Neg liver ROS,GERD  Medicated and Controlled,,Fatty liver   Endo/Other  negative endocrine ROS    Renal/GU negative Renal ROS  negative genitourinary   Musculoskeletal  (+) Arthritis , Osteoarthritis and Rheumatoid disorders,    Abdominal   Peds  (+) ADHD Hematology negative hematology ROS (+)   Anesthesia Other Findings   Reproductive/Obstetrics negative OB ROS                              Anesthesia Physical Anesthesia Plan  ASA: 2  Anesthesia Plan: General   Post-op Pain Management: Minimal or no pain anticipated   Induction: Intravenous  PONV Risk Score and Plan: Treatment may vary due to age or medical condition and Propofol infusion  Airway Management Planned: Nasal Cannula and Natural Airway  Additional Equipment:   Intra-op Plan:   Post-operative Plan:   Informed Consent: I have reviewed the patients History and Physical, chart, labs and discussed the procedure including the risks, benefits and alternatives for the proposed anesthesia with the patient or authorized representative who has indicated his/her understanding and acceptance.     Dental advisory given  Plan Discussed with: CRNA and  Surgeon  Anesthesia Plan Comments:          Anesthesia Quick Evaluation

## 2022-07-18 NOTE — Transfer of Care (Signed)
Immediate Anesthesia Transfer of Care Note  Patient: Jessica Le  Procedure(s) Performed: ESOPHAGOGASTRODUODENOSCOPY (EGD) WITH PROPOFOL BIOPSY  Patient Location: Short Stay  Anesthesia Type:General  Level of Consciousness: drowsy  Airway & Oxygen Therapy: Patient Spontanous Breathing  Post-op Assessment: Report given to RN and Post -op Vital signs reviewed and stable  Post vital signs: Reviewed and stable  Last Vitals:  Vitals Value Taken Time  BP 112/64 07/18/22 1109  Temp 36.8 C 07/18/22 1109  Pulse 78 07/18/22 1109  Resp 16 07/18/22 1109  SpO2 95 % 07/18/22 1109    Last Pain:  Vitals:   07/18/22 1109  TempSrc: Axillary  PainSc: 0-No pain         Complications: No notable events documented.

## 2022-07-22 LAB — SURGICAL PATHOLOGY

## 2022-07-25 ENCOUNTER — Encounter (HOSPITAL_COMMUNITY): Payer: Self-pay | Admitting: Internal Medicine

## 2023-01-09 DIAGNOSIS — K769 Liver disease, unspecified: Secondary | ICD-10-CM

## 2023-01-28 NOTE — Telephone Encounter (Signed)
Patient aware to have labs for LFTs, AFP.  Please arrange for MRI liver with and without contrast (EOVIST), end of January for enlarged liver lesion seen on CT 11/2022

## 2023-01-29 ENCOUNTER — Other Ambulatory Visit: Payer: Self-pay | Admitting: *Deleted

## 2023-01-29 DIAGNOSIS — K769 Liver disease, unspecified: Secondary | ICD-10-CM

## 2023-01-29 NOTE — Telephone Encounter (Signed)
PA submitted to [employer]

## 2023-02-04 NOTE — Telephone Encounter (Signed)
[employer] PA: approval #  [id number] DOS: 01/29/23-05/01/23

## 2023-03-23 ENCOUNTER — Ambulatory Visit (HOSPITAL_COMMUNITY): Payer: BC Managed Care – PPO

## 2023-03-23 ENCOUNTER — Encounter (HOSPITAL_COMMUNITY): Payer: Self-pay

## 2023-04-10 ENCOUNTER — Ambulatory Visit (HOSPITAL_COMMUNITY): Payer: BC Managed Care – PPO

## 2023-05-16 ENCOUNTER — Other Ambulatory Visit: Payer: Self-pay | Admitting: Gastroenterology

## 2024-02-25 ENCOUNTER — Encounter: Payer: Self-pay | Admitting: Gastroenterology
